# Patient Record
Sex: Male | Born: 1985 | Race: Black or African American | Hispanic: No | Marital: Married | Smoking: Current every day smoker
Health system: Southern US, Community
[De-identification: ages and names within clinical notes are randomized; demographics above are authoritative.]

## PROBLEM LIST (undated history)

## (undated) DIAGNOSIS — I10 Essential (primary) hypertension: Secondary | ICD-10-CM

## (undated) HISTORY — DX: Essential (primary) hypertension: I10

## (undated) HISTORY — PX: HAND SURGERY: SHX662

---
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
## 2006-10-05 ENCOUNTER — Emergency Department (HOSPITAL_COMMUNITY): Admission: EM | Admit: 2006-10-05 | Discharge: 2006-10-05 | Payer: Self-pay | Admitting: Emergency Medicine

## 2007-06-09 ENCOUNTER — Emergency Department (HOSPITAL_COMMUNITY): Admission: EM | Admit: 2007-06-09 | Discharge: 2007-06-09 | Payer: Self-pay | Admitting: Emergency Medicine

## 2010-01-21 ENCOUNTER — Emergency Department (HOSPITAL_COMMUNITY): Admission: EM | Admit: 2010-01-21 | Discharge: 2010-01-22 | Payer: Self-pay | Admitting: Emergency Medicine

## 2010-01-22 ENCOUNTER — Emergency Department (HOSPITAL_COMMUNITY): Admission: EM | Admit: 2010-01-22 | Discharge: 2010-01-22 | Payer: Self-pay | Admitting: Emergency Medicine

## 2011-03-15 ENCOUNTER — Inpatient Hospital Stay (INDEPENDENT_AMBULATORY_CARE_PROVIDER_SITE_OTHER)
Admission: RE | Admit: 2011-03-15 | Discharge: 2011-03-15 | Disposition: A | Discharge status: Home / Self Care | Payer: Self-pay | Source: Ambulatory Visit | Attending: Emergency Medicine | Admitting: Emergency Medicine

## 2011-03-15 ENCOUNTER — Ambulatory Visit (INDEPENDENT_AMBULATORY_CARE_PROVIDER_SITE_OTHER): Payer: Self-pay

## 2011-03-15 DIAGNOSIS — M79609 Pain in unspecified limb: Secondary | ICD-10-CM

## 2011-05-02 LAB — URINE MICROSCOPIC-ADD ON

## 2011-05-02 LAB — URINALYSIS, ROUTINE W REFLEX MICROSCOPIC
Bilirubin Urine: NEGATIVE
Glucose, UA: NEGATIVE
Hgb urine dipstick: NEGATIVE
Ketones, ur: NEGATIVE
Nitrite: NEGATIVE
Protein, ur: NEGATIVE
Specific Gravity, Urine: 1.023
Urobilinogen, UA: 1
pH: 6.5

## 2011-05-02 LAB — URINE CULTURE
Colony Count: NO GROWTH
Culture: NO GROWTH

## 2011-05-02 LAB — GC/CHLAMYDIA PROBE AMP, GENITAL: GC Probe Amp, Genital: NEGATIVE

## 2014-06-20 ENCOUNTER — Ambulatory Visit (INDEPENDENT_AMBULATORY_CARE_PROVIDER_SITE_OTHER): Payer: No Typology Code available for payment source | Admitting: Emergency Medicine

## 2014-06-20 VITALS — BP 124/80 | HR 79 | Temp 98.3°F | Resp 18 | Ht 71.0 in | Wt 159.0 lb

## 2014-06-20 DIAGNOSIS — J029 Acute pharyngitis, unspecified: Secondary | ICD-10-CM

## 2014-06-20 DIAGNOSIS — R05 Cough: Secondary | ICD-10-CM

## 2014-06-20 DIAGNOSIS — R059 Cough, unspecified: Secondary | ICD-10-CM

## 2014-06-20 LAB — POCT CBC
Granulocyte percent: 51.2 %G (ref 37–80)
HEMATOCRIT: 44.1 % (ref 43.5–53.7)
HEMOGLOBIN: 14 g/dL — AB (ref 14.1–18.1)
LYMPH, POC: 1.8 (ref 0.6–3.4)
MCH: 28 pg (ref 27–31.2)
MCHC: 31.8 g/dL (ref 31.8–35.4)
MCV: 88.1 fL (ref 80–97)
MID (cbc): 0.3 (ref 0–0.9)
MPV: 6.8 fL (ref 0–99.8)
POC Granulocyte: 2.2 (ref 2–6.9)
POC LYMPH PERCENT: 42.9 %L (ref 10–50)
POC MID %: 5.9 % (ref 0–12)
Platelet Count, POC: 258 10*3/uL (ref 142–424)
RBC: 5 M/uL (ref 4.69–6.13)
RDW, POC: 12.6 %
WBC: 4.3 10*3/uL — AB (ref 4.6–10.2)

## 2014-06-20 LAB — POCT RAPID STREP A (OFFICE): Rapid Strep A Screen: NEGATIVE

## 2014-06-20 NOTE — Progress Notes (Addendum)
Subjective:  This chart was scribed for Tom Tom Daub, MD by Haywood PaoNadim Abu Hashem, ED Scribe at [facility].The patient was seen in exam room 04 and the patient's care was started at 10:08 AM.   Patient ID: Tom Morgan, male    DOB: Mar 20, 1986, 28 y.o.   MRN: [id number] Chief Complaint  Patient presents with   Cough    x1 week   Sore Throat    From coughing x1 week   Hoarse    x1 week   HPI  HPI Comments: Tom Bowlerheodore A Handley is a 28 y.o. male who [facility] complaining of a cough, sore throat for the past week. He states is started off as a sore throat, rhinorrhea and a low grade fever. He has a HA productive cough with yellow/green sputum and loss of sleep due to the cough. Pt also states he also loses his voice when he yells. Pt believes the sputum builds up at night and he spits a lot out in the morning. He had runny nose earlier in the week but notes it has subsided. Pt says in the morning his symptoms are the worst. He drinks peppermint tea and honey which has provided some relief. Pt states he has 3 kids and a significant other ane he is worried that he may spread his sickness to them. He notes his significant mother has a cold as well.  Pt is a Naval architecttruck driver.  Review of Systems  HENT: Positive for sore throat.   Respiratory: Positive for cough.   Neurological: Positive for headaches.  Psychiatric/Behavioral: Positive for sleep disturbance.      Objective:  BP 124/80 mmHg   Pulse 79   Temp(Src) 98.3 F (36.8 C) (Oral)   Resp 18   Ht 5\' 11"  (1.803 m)   Wt 159 lb (72.122 kg)   BMI 22.19 kg/m2   SpO2 98%  Physical Exam  Constitutional: He is oriented to person, place, and time. He appears well-developed and well-nourished.  HENT:  Head: Normocephalic and atraumatic.  Right Ear: External ear normal.  Left Ear: External ear normal.  Nasal congestion.  Eyes: EOM are normal.  Neck: Normal range of motion.  Cardiovascular: Normal rate, regular rhythm and  normal heart sounds.   Pulmonary/Chest: Effort normal and breath sounds normal.  Musculoskeletal: Normal range of motion.  Neurological: He is alert and oriented to person, place, and time.  Skin: Skin is warm and dry.  Psychiatric: He has a normal mood and affect. His behavior is normal.  Nursing note and vitals reviewed.  Results for orders placed or performed in visit on 06/20/14  POCT CBC  Result Value Ref Range   WBC 4.3 (A) 4.6 - 10.2 K/uL   Lymph, poc 1.8 0.6 - 3.4   POC LYMPH PERCENT 42.9 10 - 50 %L   MID (cbc) 0.3 0 - 0.9   POC MID % 5.9 0 - 12 %M   POC Granulocyte 2.2 2 - 6.9   Granulocyte percent 51.2 37 - 80 %G   RBC 5.00 4.69 - 6.13 M/uL   Hemoglobin 14.0 (A) 14.1 - 18.1 g/dL   HCT, POC 40.944.1 81.143.5 - 53.7 %   MCV 88.1 80 - 97 fL   MCH, POC 28.0 27 - 31.2 pg   MCHC 31.8 31.8 - 35.4 g/dL   RDW, POC 91.412.6 %   Platelet Count, POC 258 142 - 424 K/uL   MPV 6.8 0 - 99.8 fL  POCT rapid strep A  Result Value Ref Range   Rapid Strep A Screen Negative Negative  No orders of the defined types were placed in this encounter.      Assessment & Plan:  Patient has a viral type illness no treatment indicated at the present time his strep test was negative.I personally performed the services described in this documentation, which was scribed in my presence. The recorded information has been reviewed and is accurate.

## 2014-06-22 LAB — CULTURE, GROUP A STREP: Organism ID, Bacteria: NORMAL

## 2014-06-24 ENCOUNTER — Ambulatory Visit: Payer: No Typology Code available for payment source

## 2015-07-21 ENCOUNTER — Ambulatory Visit (INDEPENDENT_AMBULATORY_CARE_PROVIDER_SITE_OTHER): Payer: PRIVATE HEALTH INSURANCE | Admitting: Physician Assistant

## 2015-07-21 VITALS — BP 120/80 | HR 75 | Temp 99.5°F | Resp 16 | Ht 71.0 in | Wt 161.0 lb

## 2015-07-21 DIAGNOSIS — A084 Viral intestinal infection, unspecified: Secondary | ICD-10-CM

## 2015-07-21 MED ORDER — ONDANSETRON 8 MG PO TBDP: 8.0000 mg | ORAL_TABLET | Freq: Three times a day (TID) | ORAL | Status: DC | PRN

## 2015-07-21 NOTE — Progress Notes (Signed)
   Patient ID: Tom Morgan, male    DOB: 1986/06/30, 29 y.o.   MRN: [id number]  PCP: No primary care provider on file.  Subjective:   Chief Complaint  Patient presents with  . Nausea    x 1 day  . Diarrhea  . Fever  . Dizziness    HPI Presents for evaluation of nausea and vomiting.  Nausea, vomiting, diarrhea, alternating feeling hot and cold began about 1 am today.  No blood or mucous in the emesis or stool. Became dizzy and had near-syncope-fell about 2 pm today. Did not lose consciousness. Has been working on keeping hydrated since then. Drinking Gatorade. Has had 12 ounces since 2 pm.  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  No known sick contacts. 3 kids (2 biological, 1 step), but they are not sick.  No new or unusual foods. No recent travel.  Review of Systems As above.     There are no active problems to display for this patient.    Prior to Admission medications   Not on File     No Known Allergies     Objective:  Physical Exam  Constitutional: He is oriented to person, place, and time. Vital signs are normal. He appears well-developed and well-nourished. He is active and cooperative. No distress.  BP 120/80 mmHg  Pulse 75  Temp(Src) 99.5 F (37.5 C) (Oral)  Resp 16  Ht 5\' 11"  (1.803 m)  Wt 161 lb (73.029 kg)  BMI 22.46 kg/m2  SpO2 98%  HENT:  Head: Normocephalic and atraumatic.  Right Ear: Hearing normal.  Left Ear: Hearing normal.  Eyes: Conjunctivae are normal. No scleral icterus.  Neck: Normal range of motion. Neck supple. No thyromegaly present.  Cardiovascular: Normal rate, regular rhythm and normal heart sounds.   Pulses:      Radial pulses are 2+ on the right side, and 2+ on the left side.  Pulmonary/Chest: Effort normal and breath sounds normal.  Abdominal: There is no hepatosplenomegaly. There is no tenderness (reports that he had epigastric tenderness earlier today). There is no rigidity, no rebound, no guarding, no  tenderness at McBurney's point and negative Murphy's sign.  Lymphadenopathy:       Head (right side): No tonsillar, no preauricular, no posterior auricular and no occipital adenopathy present.       Head (left side): No tonsillar, no preauricular, no posterior auricular and no occipital adenopathy present.    He has no cervical adenopathy.       Right: No supraclavicular adenopathy present.       Left: No supraclavicular adenopathy present.  Neurological: He is alert and oriented to person, place, and time. No sensory deficit.  Skin: Skin is warm, dry and intact. No rash noted. No cyanosis or erythema. Nails show no clubbing.  Psychiatric: He has a normal mood and affect. His speech is normal and behavior is normal.           Assessment & Plan:   1. Viral gastroenteritis Supportive care. Anticipatory guidance. OOW tonight. May return tomorrow if symptoms are resolved, otherwise I would extend his note x 1-2 additional days. If symptoms persist beyond that, advise to RTC. - ondansetron (ZOFRAN-ODT) 8 MG disintegrating tablet; Take 1 tablet (8 mg total) by mouth every 8 (eight) hours as needed for nausea.  Dispense: 30 tablet; Refill: 0   Fernande Brashelle S. Bronwen Pendergraft, PA-C Physician Assistant-Certified [facility]

## 2015-07-21 NOTE — Patient Instructions (Signed)

## 2017-04-13 ENCOUNTER — Emergency Department (HOSPITAL_COMMUNITY): Payer: Self-pay

## 2017-04-13 ENCOUNTER — Encounter (HOSPITAL_COMMUNITY): Payer: Self-pay

## 2017-04-13 ENCOUNTER — Emergency Department (HOSPITAL_COMMUNITY)
Admission: EM | Admit: 2017-04-13 | Discharge: 2017-04-13 | Disposition: A | Discharge status: Home / Self Care | Payer: Self-pay | Attending: Emergency Medicine | Admitting: Emergency Medicine

## 2017-04-13 DIAGNOSIS — Y929 Unspecified place or not applicable: Secondary | ICD-10-CM | POA: Insufficient documentation

## 2017-04-13 DIAGNOSIS — W230XXA Caught, crushed, jammed, or pinched between moving objects, initial encounter: Secondary | ICD-10-CM | POA: Insufficient documentation

## 2017-04-13 DIAGNOSIS — F17210 Nicotine dependence, cigarettes, uncomplicated: Secondary | ICD-10-CM | POA: Insufficient documentation

## 2017-04-13 DIAGNOSIS — Y999 Unspecified external cause status: Secondary | ICD-10-CM | POA: Insufficient documentation

## 2017-04-13 DIAGNOSIS — S60221A Contusion of right hand, initial encounter: Secondary | ICD-10-CM | POA: Insufficient documentation

## 2017-04-13 DIAGNOSIS — S6010XA Contusion of unspecified finger with damage to nail, initial encounter: Secondary | ICD-10-CM | POA: Insufficient documentation

## 2017-04-13 DIAGNOSIS — Y939 Activity, unspecified: Secondary | ICD-10-CM | POA: Insufficient documentation

## 2017-04-13 MED ORDER — OXYCODONE-ACETAMINOPHEN 5-325 MG PO TABS
1.0000 | ORAL_TABLET | Freq: Once | ORAL | Status: AC
  Administered 2017-04-13: 1 via ORAL
  Filled 2017-04-13: qty 1

## 2017-04-13 MED ORDER — NAPROXEN 500 MG PO TABS: 500.0000 mg | ORAL_TABLET | Freq: Two times a day (BID) | ORAL | 0 refills | Status: DC

## 2017-04-13 NOTE — ED Notes (Signed)
Pt ambulatory and independent at discharge.  Verbalized understanding of discharge instructions 

## 2017-04-13 NOTE — ED Triage Notes (Signed)
Patient states a tractor trailer latch fell back on his right hand this AM and is now having sharp pain.

## 2017-04-13 NOTE — ED Provider Notes (Signed)
[facility]-EMERGENCY DEPT Provider Note   CSN: [id number] Arrival date & time: 04/13/17  1540     History   Chief Complaint Chief Complaint  Patient presents with  . Hand Pain    HPI Tom Morgan is a 31 y.o. male who presents to the ED with right hand pain. The pain started suddenly when he was was closing a tractor trailer door and the handle latch slammed down on the back of the patient's hand. He complains of pain and swelling. The injury happened at 9 am.    The history is provided by the patient. No language interpreter was used.  Hand Pain  This is a new problem. The current episode started 6 to 12 hours ago. The problem has been gradually worsening. Pertinent negatives include no chest pain and no shortness of breath. Exacerbated by: movement of hand. He has tried nothing for the symptoms.    History reviewed. No pertinent past medical history.  There are no active problems to display for this patient.   Past Surgical History:  Procedure Laterality Date  . HAND SURGERY         Home Medications    Prior to Admission medications   Medication Sig Start Date End Date Taking? Authorizing Provider  naproxen (NAPROSYN) 500 MG tablet Take 1 tablet (500 mg total) by mouth 2 (two) times daily. 04/13/17   Janne Napoleon, NP  ondansetron (ZOFRAN-ODT) 8 MG disintegrating tablet Take 1 tablet (8 mg total) by mouth every 8 (eight) hours as needed for nausea. 07/21/15   Porfirio Oar, PA-C    Family History Family History  Problem Relation Age of Onset  . Osteoarthritis Mother     Social History Social History  Substance Use Topics  . Smoking status: Current Every Day Smoker    Packs/day: 0.50    Types: Cigarettes  . Smokeless tobacco: Never Used  . Alcohol use No     Allergies   Patient has no known allergies.   Review of Systems Review of Systems  Constitutional: Negative for diaphoresis.  HENT: Negative.   Eyes: Negative for visual disturbance.    Respiratory: Negative for shortness of breath.   Cardiovascular: Negative for chest pain.  Gastrointestinal: Negative for nausea and vomiting.  Musculoskeletal: Positive for arthralgias.       Right hand pain  Skin: Positive for wound.     Physical Exam Updated Vital Signs BP 135/86 (BP Location: Left Arm)   Pulse 72   Temp 98.6 F (37 C) (Oral)   Resp 17   Ht  (1.803 m)   Wt 76.2 kg (168 lb)   SpO2 98%   BMI 23.43 kg/m   Physical Exam  Constitutional: He is oriented to person, place, and time. He appears well-developed and well-nourished.  Eyes: EOM are normal.  Neck: Neck supple.  Cardiovascular: Normal rate.   Pulmonary/Chest: Effort normal.  Musculoskeletal:       Right hand: Normal sensation noted. Normal strength noted.       Hands: Right thumb with subungual hematoma. There is swelling and tenderness with palpation and range of motion to the thumb and index finger. Radial pulse 2+, adequate circulation.   Neurological: He is alert and oriented to person, place, and time. No cranial nerve deficit.  Skin: Skin is warm and dry.  Psychiatric: He has a normal mood and affect.  Nursing note and vitals reviewed.    ED Treatments / Results  Labs (all labs ordered are listed,  but only abnormal results are displayed) Labs Reviewed - No data to display  Radiology Dg Hand Complete Right  Result Date: 04/13/2017 CLINICAL DATA:  Injured thumb and tractor trailer door this morning. First and second finger pain. EXAM: RIGHT HAND - COMPLETE 3+ VIEW COMPARISON:  None. FINDINGS: There is no evidence of fracture or dislocation. There is no evidence of arthropathy or other focal bone abnormality. Soft tissues are unremarkable. IMPRESSION: Negative. Electronically Signed   By: Elberta Fortis M.D.   On: 04/13/2017 17:55    Procedures .Marland KitchenIncision and Drainage Date/Time: 04/13/2017 6:13 PM Performed by: Janne Napoleon Authorized by: Janne Napoleon   Consent:    Consent  obtained:  Verbal   Consent given by:  Patient   Risks discussed:  Bleeding Location:    Type:  Subungual hematoma   Location:  Upper extremity   Upper extremity location:  Finger   Finger location:  R thumb Pre-procedure details:    Skin preparation:  Betadine Anesthesia (see MAR for exact dosages):    Anesthesia method:  None Procedure type:    Complexity:  Simple Procedure details:    Needle aspiration: no     Drainage:  Bloody Comments:     Using a cautery a hole was made in the thumb nail and blood evacuated. Patient reported relief after procedure.    (including critical care time)  Medications Ordered in ED Medications  oxyCODONE-acetaminophen (PERCOCET/ROXICET) 5-325 MG per tablet 1 tablet (1 tablet Oral Given 04/13/17 1819)     Initial Impression / Assessment and Plan / ED Course  I have reviewed the triage vital signs and the nursing notes.  Pertinent imaging results that were available during my care of the patient were reviewed by me and considered in my medical decision making (see chart for details). 31 y.o. male with injury to the right hand and subungual hematoma of the right thumb stable for d/c without fracture or dislocation. Pain managed in the ED. Return precautions discussed   Final Clinical Impressions(s) / ED Diagnoses   Final diagnoses:  Contusion of right hand, initial encounter  Subungual hematoma of digit of hand, initial encounter    New Prescriptions Discharge Medication List as of 04/13/2017  6:38 PM    START taking these medications   Details  naproxen (NAPROSYN) 500 MG tablet Take 1 tablet (500 mg total) by mouth 2 (two) times daily., Starting Fri 04/13/2017, Print         Westwood, Nixon, NP 04/13/17 2319    Lavera Guise, MD 04/14/17 [phone]

## 2017-10-21 ENCOUNTER — Encounter (HOSPITAL_COMMUNITY): Payer: Self-pay | Admitting: *Deleted

## 2017-10-21 ENCOUNTER — Emergency Department (HOSPITAL_COMMUNITY)
Admission: EM | Admit: 2017-10-21 | Discharge: 2017-10-21 | Disposition: A | Discharge status: Home / Self Care | Payer: Self-pay | Attending: Emergency Medicine | Admitting: Emergency Medicine

## 2017-10-21 ENCOUNTER — Other Ambulatory Visit: Payer: Self-pay

## 2017-10-21 DIAGNOSIS — K0889 Other specified disorders of teeth and supporting structures: Secondary | ICD-10-CM | POA: Insufficient documentation

## 2017-10-21 DIAGNOSIS — Z79899 Other long term (current) drug therapy: Secondary | ICD-10-CM | POA: Insufficient documentation

## 2017-10-21 DIAGNOSIS — F17210 Nicotine dependence, cigarettes, uncomplicated: Secondary | ICD-10-CM | POA: Insufficient documentation

## 2017-10-21 MED ORDER — LIDOCAINE VISCOUS 2 % MT SOLN: 15.0000 mL | OROMUCOSAL | 2 refills | Status: DC | PRN

## 2017-10-21 MED ORDER — PENICILLIN V POTASSIUM 500 MG PO TABS: 500.0000 mg | ORAL_TABLET | Freq: Four times a day (QID) | ORAL | 0 refills | Status: AC

## 2017-10-21 NOTE — ED Provider Notes (Signed)
[facility] Provider Note   CSN: [id number] Arrival date & time: 10/21/17  1718     History   Chief Complaint Chief Complaint  Patient presents with  . Dental Problem    HPI Tom Morgan is a 32 y.o. male.  HPI   Tom Morgan is a 32 y.o. male, patient with no pertinent past medical history, presenting to the ED with right lower dental pain for the last 2 weeks, worsening over the past 3-4 days.  States a piece of his tooth broke off 2 weeks ago while he was eating chips.  He has a Education officer, communitydentist, however, he has not seen his dentist for this issue.  Has been taking BC powders and ibuprofen.  Denies fever/chills, nausea/vomiting, difficulty breathing or swallowing, facial swelling, or any other complaints.       History reviewed. No pertinent past medical history.  There are no active problems to display for this patient.   Past Surgical History:  Procedure Laterality Date  . HAND SURGERY          Home Medications    Prior to Admission medications   Medication Sig Start Date End Date Taking? Authorizing Provider  lidocaine (XYLOCAINE) 2 % solution Use as directed 15 mLs in the mouth or throat as needed for mouth pain. 10/21/17   Joy, Shawn C, PA-C  naproxen (NAPROSYN) 500 MG tablet Take 1 tablet (500 mg total) by mouth 2 (two) times daily. 04/13/17   Janne NapoleonNeese, Hope M, NP  ondansetron (ZOFRAN-ODT) 8 MG disintegrating tablet Take 1 tablet (8 mg total) by mouth every 8 (eight) hours as needed for nausea. 07/21/15   Porfirio OarJeffery, Chelle, PA-C  penicillin v potassium (VEETID) 500 MG tablet Take 1 tablet (500 mg total) by mouth 4 (four) times daily for 7 days. 10/21/17 10/28/17  Anselm PancoastJoy, Shawn C, PA-C    Family History Family History  Problem Relation Age of Onset  . Osteoarthritis Mother     Social History Social History   Tobacco Use  . Smoking status: Current Every Day Smoker    Packs/day: 0.50    Types: Cigarettes  . Smokeless  tobacco: Never Used  Substance Use Topics  . Alcohol use: No    Alcohol/week: 0.0 oz  . Drug use: No     Allergies   Patient has no known allergies.   Review of Systems Review of Systems  Constitutional: Negative for chills and fever.  HENT: Positive for dental problem. Negative for facial swelling, sore throat, trouble swallowing and voice change.   Respiratory: Negative for shortness of breath.   Gastrointestinal: Negative for nausea and vomiting.  Musculoskeletal: Negative for neck pain.  All other systems reviewed and are negative.    Physical Exam Updated Vital Signs BP (!) 141/84 (BP Location: Right Arm)   Pulse 77   Temp 97.9 F (36.6 C) (Oral)   Resp 12   Ht 5\' 11"  (1.803 m)   Wt 74.8 kg (165 lb)   SpO2 100%   BMI 23.01 kg/m   Physical Exam  Constitutional: He appears well-developed and well-nourished. No distress.  HENT:  Head: Normocephalic and atraumatic.  Lateral portion missing from the right mandibular second to rearmost molar.  No noted area of swelling or fluctuance.  No trismus.  Mouth opening to at least 3 finger widths.  Handles oral secretions without difficulty.  No noted facial swelling.  No swelling or tenderness to the submental or submandibular regions.  No swelling  or tenderness into the soft tissues of the neck.  Eyes: Conjunctivae are normal.  Neck: Normal range of motion. Neck supple.  Cardiovascular: Normal rate, regular rhythm and intact distal pulses.  Pulmonary/Chest: Effort normal. No respiratory distress.  Abdominal: There is no guarding.  Musculoskeletal: He exhibits no edema.  Lymphadenopathy:    He has no cervical adenopathy.  Neurological: He is alert.  Skin: Skin is warm and dry. He is not diaphoretic.  Psychiatric: He has a normal mood and affect. His behavior is normal.  Nursing note and vitals reviewed.    ED Treatments / Results  Labs (all labs ordered are listed, but only abnormal results are displayed) Labs  Reviewed - No data to display  EKG None  Radiology No results found.  Procedures Procedures (including critical care time)  Medications Ordered in ED Medications - No data to display   Initial Impression / Assessment and Plan / ED Course  I have reviewed the triage vital signs and the nursing notes.  Pertinent labs & imaging results that were available during my care of the patient were reviewed by me and considered in my medical decision making (see chart for details).     Patient presents with dental pain.  Low suspicion for sepsis or Ludwig's angioedema. I spoke with the patient about a dental block as well as using temporary cement to cover the fractured area.  Patient declined.  States he will see his dentist this week. Patient was also counseled against overuse of NSAIDs. Dental follow up. The patient was given instructions for home care as well as return precautions. Patient voices understanding of these instructions, accepts the plan, and is comfortable with discharge.   Final Clinical Impressions(s) / ED Diagnoses   Final diagnoses:  Pain, dental    ED Discharge Orders        Ordered    lidocaine (XYLOCAINE) 2 % solution  As needed     10/21/17 1846    penicillin v potassium (VEETID) 500 MG tablet  4 times daily     10/21/17 1848       Concepcion Living 10/21/17 1854    Rolland Porter, MD 10/22/17 2350

## 2017-10-21 NOTE — Discharge Instructions (Addendum)
°  Dental Pain You have been seen today for dental pain. You should follow up with a dentist as soon as possible. This problem will not resolve on its own without the care of a dentist. Use ibuprofen or naproxen for pain. Use the viscous lidocaine for mouth pain. Swish with the lidocaine and spit it out. Do not swallow it.   Please take all of your antibiotics until finished!   You may develop abdominal discomfort or diarrhea from the antibiotic.  You may help offset this with probiotics which you can buy or get in yogurt. Do not eat or take the probiotics until 2 hours after your antibiotic.   Dental Resource Guide  [facility] [address], [address] [place], [place] [address] [phone]  [facility] [address] [place], [place] [address] [phone]  [facility] [address] [place], [place] [address] [phone]  [facility] [address] [place], [place] [address] [phone]  [facility] [address] [place], [place] [address] [phone]  [facility] [email or web address]/[email or web address]  [employer] [address] [facility] [place], [place] [address] [phone]  Website for free, low-income, or sliding scale dental services in : www.freedental.us  To find a dentist in [place] and surrounding areas: [employer].[email or web address]/for-the-public/find-[email or web address]-dentist  [facility] Test[employer].athttp://www.ncdental.org/meetings-events/-missions-of-mercy  [facility] Medicaid Dentist http://www.harris.net/https://dma.ncdhhs.gov/find-[email or web address]-doctor/medicaid-dental-providers

## 2017-10-21 NOTE — ED Triage Notes (Signed)
PT reports pain to Rt posterior ,lower .

## 2017-10-21 NOTE — ED Triage Notes (Signed)
PT took  3 BC powders for pain today.

## 2017-10-21 NOTE — ED Notes (Signed)
Declined W/C at D/C and was escorted to lobby by RN. 

## 2018-06-09 ENCOUNTER — Emergency Department (HOSPITAL_COMMUNITY): Payer: Self-pay

## 2018-06-09 ENCOUNTER — Emergency Department (HOSPITAL_COMMUNITY)
Admission: EM | Admit: 2018-06-09 | Discharge: 2018-06-09 | Disposition: A | Discharge status: Home / Self Care | Payer: Self-pay | Attending: Emergency Medicine | Admitting: Emergency Medicine

## 2018-06-09 ENCOUNTER — Other Ambulatory Visit: Payer: Self-pay

## 2018-06-09 ENCOUNTER — Encounter (HOSPITAL_COMMUNITY): Payer: Self-pay

## 2018-06-09 DIAGNOSIS — F17210 Nicotine dependence, cigarettes, uncomplicated: Secondary | ICD-10-CM | POA: Insufficient documentation

## 2018-06-09 DIAGNOSIS — M549 Dorsalgia, unspecified: Secondary | ICD-10-CM

## 2018-06-09 DIAGNOSIS — W01198A Fall on same level from slipping, tripping and stumbling with subsequent striking against other object, initial encounter: Secondary | ICD-10-CM | POA: Insufficient documentation

## 2018-06-09 DIAGNOSIS — M5489 Other dorsalgia: Secondary | ICD-10-CM | POA: Insufficient documentation

## 2018-06-09 MED ORDER — NAPROXEN 500 MG PO TABS: 500.0000 mg | ORAL_TABLET | Freq: Two times a day (BID) | ORAL | 0 refills | Status: DC

## 2018-06-09 MED ORDER — METHOCARBAMOL 500 MG PO TABS: 500.0000 mg | ORAL_TABLET | Freq: Three times a day (TID) | ORAL | 0 refills | Status: DC | PRN

## 2018-06-09 NOTE — ED Triage Notes (Addendum)
Pt reports on 11/14 he was working on a loading truck and a "metal transporting cabinet" fell on him. He fell on his back with the cabinet pinning him on the ground. Pt reports lower back pain. Pt denies hitting head or any other injury.

## 2018-06-09 NOTE — ED Notes (Signed)
Bed: WTR6 Expected date:  Expected time:  Means of arrival:  Comments: 
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
## 2018-06-09 NOTE — Discharge Instructions (Addendum)
You were seen in the emergency department for back pain today.  Your xrays did not show any broken bones or fractures. It did show that you have mild scoliosis- curvature- of your spine that is unlikely to be related to your injury. We suspect you have some muscle soreness/bruising from the fall.    I have prescribed you an anti-inflammatory medication and a muscle relaxer.  - Naproxen is a nonsteroidal anti-inflammatory medication that will help with pain and swelling. Be sure to take this medication as prescribed with food, 1 pill every 12 hours,  It should be taken with food, as it can cause stomach upset, and more seriously, stomach bleeding. Do not take other nonsteroidal anti-inflammatory medications with this such as Advil, Motrin, Aleve, Mobic, Goodie Powder, or Motrin.    - Robaxin is the muscle relaxer I have prescribed, this is meant to help with muscle tightness. Be aware that this medication may make you drowsy therefore the first time you take this it should be at a time you are in an environment where you can rest. Do not drive or operate heavy machinery when taking this medication. Do not drink alcohol or take other sedating medications with this medicine such as narcotics or benzodiazepines.   You make take Tylenol per over the counter dosing with these medications.   We have prescribed you new medication(s) today. Discuss the medications prescribed today with your pharmacist as they can have adverse effects and interactions with your other medicines including over the counter and prescribed medications. Seek medical evaluation if you start to experience new or abnormal symptoms after taking one of these medicines, seek care immediately if you start to experience difficulty breathing, feeling of your throat closing, facial swelling, or rash as these could be indications of a more serious allergic reaction   The application of heat can help soothe the pain.  Maintaining your daily  activities, including walking, is encourged, as it will help you get better faster than just staying in bed.  Your pain should get better over the next 2 weeks.  You will need to follow up with  Your primary healthcare provider in 1-2 weeks for reassessment, if you do not have a primary care provider one is provided in your discharge instructions- you may see the [facility] or call the provided phone number. However return to the ER should you develop ne or worsening symptoms or any other concerns including but not limited to severe or worsening pain, low back pain with fever, numbness, weakness, loss of bowel or bladder control, or inability to walk or urinate, you should return to the ER immediately.

## 2018-07-15 ENCOUNTER — Emergency Department (HOSPITAL_COMMUNITY)
Admission: EM | Admit: 2018-07-15 | Discharge: 2018-07-15 | Disposition: A | Discharge status: Home / Self Care | Payer: Self-pay | Attending: Emergency Medicine | Admitting: Emergency Medicine

## 2018-07-15 ENCOUNTER — Other Ambulatory Visit: Payer: Self-pay

## 2018-07-15 ENCOUNTER — Encounter (HOSPITAL_COMMUNITY): Payer: Self-pay | Admitting: Emergency Medicine

## 2018-07-15 DIAGNOSIS — K029 Dental caries, unspecified: Secondary | ICD-10-CM | POA: Insufficient documentation

## 2018-07-15 DIAGNOSIS — F17210 Nicotine dependence, cigarettes, uncomplicated: Secondary | ICD-10-CM | POA: Insufficient documentation

## 2018-07-15 DIAGNOSIS — Z79899 Other long term (current) drug therapy: Secondary | ICD-10-CM | POA: Insufficient documentation

## 2018-07-15 MED ORDER — NAPROXEN 500 MG PO TABS: ORAL_TABLET | ORAL | 0 refills | Status: DC

## 2018-07-15 MED ORDER — PENICILLIN V POTASSIUM 500 MG PO TABS
500.0000 mg | ORAL_TABLET | Freq: Once | ORAL | Status: AC
  Administered 2018-07-15: 500 mg via ORAL
  Filled 2018-07-15: qty 1

## 2018-07-15 MED ORDER — BUPIVACAINE-EPINEPHRINE (PF) 0.5% -1:200000 IJ SOLN
1.8000 mL | Freq: Once | INTRAMUSCULAR | Status: AC
Start: 2018-07-15 — End: 2018-07-15
  Administered 2018-07-15: 1.8 mL
  Filled 2018-07-15: qty 1.8

## 2018-07-15 MED ORDER — PENICILLIN V POTASSIUM 500 MG PO TABS: 500.0000 mg | ORAL_TABLET | Freq: Four times a day (QID) | ORAL | 0 refills | Status: AC

## 2018-07-15 NOTE — ED Provider Notes (Signed)
[facility]-EMERGENCY DEPT Provider Note: Tom DellJ. Lane Tom Upshur, MD, FACEP  CSN: [id number] MRN: [id number] ARRIVAL: 07/15/18 at 0412 ROOM: WA13/WA13   CHIEF COMPLAINT  Dental Pain   HISTORY OF PRESENT ILLNESS  07/15/18 4:47 AM Tom Bowlerheodore A Morgan is a 32 y.o. male with pain in the bottom right side of his mouth associated with a broken tooth.  The pain is been present for several hours and he rates it as a 10 out of 10.  Pain is worse with eating or drinking.   History reviewed. No pertinent past medical history.  Past Surgical History:  Procedure Laterality Date  . HAND SURGERY      Family History  Problem Relation Age of Onset  . Osteoarthritis Mother     Social History   Tobacco Use  . Smoking status: Current Every Day Smoker    Packs/day: 0.50    Types: Cigarettes  . Smokeless tobacco: Never Used  Substance Use Topics  . Alcohol use: No    Alcohol/week: 0.0 standard drinks  . Drug use: No    Prior to Admission medications   Medication Sig Start Date End Date Taking? Authorizing Provider  lidocaine (XYLOCAINE) 2 % solution Use as directed 15 mLs in the mouth or throat as needed for mouth pain. 10/21/17   Joy, Shawn C, PA-C  methocarbamol (ROBAXIN) 500 MG tablet Take 1 tablet (500 mg total) by mouth every 8 (eight) hours as needed. 06/09/18   Petrucelli, Samantha R, PA-C  naproxen (NAPROSYN) 500 MG tablet Take 1 tablet (500 mg total) by mouth 2 (two) times daily. 06/09/18   Petrucelli, Samantha R, PA-C  ondansetron (ZOFRAN-ODT) 8 MG disintegrating tablet Take 1 tablet (8 mg total) by mouth every 8 (eight) hours as needed for nausea. 07/21/15   Porfirio OarJeffery, Chelle, PA    Allergies Patient has no known allergies.   REVIEW OF SYSTEMS  Negative except as noted here or in the History of Present Illness.   PHYSICAL EXAMINATION  Initial Vital Signs Blood pressure 126/73, pulse 70, temperature 97.7 F (36.5 C), temperature source Oral, resp. rate 16, height 5\' 11"  (1.803 m),  weight 77.1 kg, SpO2 99 %.  Examination General: Well-developed, well-nourished male in no acute distress; appearance consistent with age of record HENT: normocephalic; atraumatic; carious right lower second and third molars with tenderness to percussion Eyes: pupils equal, round and reactive to light; extraocular muscles intact Neck: supple; no lymphadenopathy Heart: regular rate and rhythm Lungs: clear to auscultation bilaterally Abdomen: soft; nondistended; nontender; bowel sounds present Extremities: No deformity; full range of motion Neurologic: Awake, alert and oriented; motor function intact in all extremities and symmetric; no facial droop Skin: Warm and dry Psychiatric: Normal mood and affect   RESULTS  Summary of this visit's results, reviewed by myself:   EKG Interpretation  Date/Time:    Ventricular Rate:    PR Interval:    QRS Duration:   QT Interval:    QTC Calculation:   R Axis:     Text Interpretation:        Laboratory Studies: No results found for this or any previous visit (from the past 24 hour(s)). Imaging Studies: No results found.  ED COURSE and MDM  Nursing notes and initial vitals signs, including pulse oximetry, reviewed.  Vitals:   07/15/18 0420 07/15/18 0421 07/15/18 0443  BP: (!) 142/91 (!) 142/91 126/73  Pulse: 77 71 70  Resp: 16 18 16   Temp: 97.7 F (36.5 C) 97.7 F (36.5 C)  TempSrc: Oral Oral   SpO2: 100% 99% 99%  Weight: 77.1 kg    Height: 5\' 11"  (1.803 m)      PROCEDURES   DENTAL BLOCK 1.8 milliliters of 0.5% bupivacaine with epinephrine were injected into the buccal fold adjacent to the right lower second and third molars. The patient tolerated this well and there were no immediate complications. Adequate analgesia was obtained.  ED DIAGNOSES     ICD-10-CM   1. Pain due to dental caries K02.9        Giovanna Kemmerer, Jonny RuizJohn, MD 07/15/18 [id number]

## 2018-07-15 NOTE — ED Triage Notes (Signed)
Patient is complaining that is throbbing on the back bottom of the right side of the mouth. The tooth has broken off. The pain started this am.

## 2020-05-14 ENCOUNTER — Encounter (HOSPITAL_COMMUNITY): Payer: Self-pay | Admitting: Emergency Medicine

## 2020-05-14 ENCOUNTER — Other Ambulatory Visit: Payer: Self-pay

## 2020-05-14 ENCOUNTER — Emergency Department (HOSPITAL_COMMUNITY): Payer: Self-pay

## 2020-05-14 ENCOUNTER — Emergency Department (HOSPITAL_COMMUNITY)
Admission: EM | Admit: 2020-05-14 | Discharge: 2020-05-14 | Disposition: A | Discharge status: Home / Self Care | Payer: Self-pay | Attending: Emergency Medicine | Admitting: Emergency Medicine

## 2020-05-14 DIAGNOSIS — R1011 Right upper quadrant pain: Secondary | ICD-10-CM | POA: Insufficient documentation

## 2020-05-14 DIAGNOSIS — R1013 Epigastric pain: Secondary | ICD-10-CM | POA: Insufficient documentation

## 2020-05-14 DIAGNOSIS — F17210 Nicotine dependence, cigarettes, uncomplicated: Secondary | ICD-10-CM | POA: Insufficient documentation

## 2020-05-14 LAB — COMPREHENSIVE METABOLIC PANEL
ALT: 16 U/L (ref 0–44)
AST: 20 U/L (ref 15–41)
Albumin: 4.2 g/dL (ref 3.5–5.0)
Alkaline Phosphatase: 38 U/L (ref 38–126)
Anion gap: 9 (ref 5–15)
BUN: 9 mg/dL (ref 6–20)
CO2: 27 mmol/L (ref 22–32)
Calcium: 9.4 mg/dL (ref 8.9–10.3)
Chloride: 104 mmol/L (ref 98–111)
Creatinine, Ser: 1.01 mg/dL (ref 0.61–1.24)
GFR, Estimated: >60 mL/min (ref >60)
Glucose, Bld: 103 mg/dL — ABNORMAL HIGH (ref 70–99)
Potassium: 4.7 mmol/L (ref 3.5–5.1)
Sodium: 140 mmol/L (ref 135–145)
Total Bilirubin: 0.7 mg/dL (ref 0.3–1.2)
Total Protein: 6.8 g/dL (ref 6.5–8.1)

## 2020-05-14 LAB — CBC
HCT: 44 % (ref 39.0–52.0)
Hemoglobin: 14.3 g/dL (ref 13.0–17.0)
MCH: 29.4 pg (ref 26.0–34.0)
MCHC: 32.5 g/dL (ref 30.0–36.0)
MCV: 90.5 fL (ref 80.0–100.0)
Platelets: 275 10*3/uL (ref 150–400)
RBC: 4.86 MIL/uL (ref 4.22–5.81)
RDW: 11.9 % (ref 11.5–15.5)
WBC: 4.6 10*3/uL (ref 4.0–10.5)
nRBC: 0 % (ref 0.0–0.2)

## 2020-05-14 LAB — URINALYSIS, ROUTINE W REFLEX MICROSCOPIC
Bilirubin Urine: NEGATIVE
Glucose, UA: NEGATIVE mg/dL
Hgb urine dipstick: NEGATIVE
Ketones, ur: 5 mg/dL — AB
Leukocytes,Ua: NEGATIVE
Nitrite: NEGATIVE
Protein, ur: NEGATIVE mg/dL
Specific Gravity, Urine: 1.029 (ref 1.005–1.030)
pH: 5 (ref 5.0–8.0)

## 2020-05-14 LAB — LIPASE, BLOOD: Lipase: 22 U/L (ref 11–51)

## 2020-05-14 MED ORDER — OMEPRAZOLE 20 MG PO CPDR: 20.0000 mg | DELAYED_RELEASE_CAPSULE | Freq: Every day | ORAL | 0 refills | Status: DC

## 2020-05-14 MED ORDER — FAMOTIDINE 20 MG PO TABS: 20.0000 mg | ORAL_TABLET | Freq: Two times a day (BID) | ORAL | 0 refills | Status: DC

## 2020-05-14 MED ORDER — OXYCODONE-ACETAMINOPHEN 5-325 MG PO TABS
1.0000 | ORAL_TABLET | ORAL | Status: DC | PRN
  Administered 2020-05-14: 1 via ORAL
  Filled 2020-05-14: qty 1

## 2020-05-14 NOTE — ED Provider Notes (Signed)
[facility] EMERGENCY DEPARTMENT Provider Note   CSN: [id number] Arrival date & time: 05/14/20  1937     History Chief Complaint  Patient presents with   Abdominal Pain    Tom Morgan is a 34 y.o. male.  Patient presents the emergency department today for evaluation of right upper quadrant and epigastric abdominal pain that has been ongoing over the past 1 month.  Pain is intermittent but occurs every day.  It was severe this morning.  He describes that the pain was so bad he was unable to get out of his vehicle to walk his son into school.  No associated nausea, vomiting, or diarrhea.  He has been alternating Tylenol and ibuprofen for pain.  He does not take NSAIDs every day.  He denies heavy alcohol use.  No chest pain or shortness of breath.  He is a smoker, half a pack daily.  No urinary symptoms.  No history of hypertension, high cholesterol, or diabetes.  Patient denies chest pain, shortness of breath.  He denies associated diaphoresis, radiation of pain, vomiting with pain.  Symptoms are not exertional.        History reviewed. No pertinent past medical history.  There are no problems to display for this patient.   Past Surgical History:  Procedure Laterality Date   HAND SURGERY         Family History  Problem Relation Age of Onset   Osteoarthritis Mother     Social History   Tobacco Use   Smoking status: Current Every Day Smoker    Packs/day: 0.50    Types: Cigarettes   Smokeless tobacco: Never Used  Vaping Use   Vaping Use: Never used  Substance Use Topics   Alcohol use: No    Alcohol/week: 0.0 standard drinks   Drug use: No    Home Medications Prior to Admission medications   Medication Sig Start Date End Date Taking? Authorizing Provider  naproxen (NAPROSYN) 500 MG tablet Take 1 tablet twice daily as needed for dental pain. Patient not taking: Reported on 05/14/2020 07/15/18   Molpus, Jonny Ruiz, MD    Allergies      Patient has no known allergies.  Review of Systems   Review of Systems  Constitutional: Negative for fever.  HENT: Negative for rhinorrhea and sore throat.   Eyes: Negative for redness.  Respiratory: Negative for cough and shortness of breath.   Cardiovascular: Negative for chest pain.  Gastrointestinal: Positive for abdominal pain. Negative for diarrhea, nausea and vomiting.  Genitourinary: Negative for dysuria and hematuria.  Musculoskeletal: Negative for myalgias.  Skin: Negative for rash.  Neurological: Negative for headaches.    Physical Exam Updated Vital Signs BP 138/90    Pulse 76    Temp 98.5 F (36.9 C) (Oral)    Resp 14    Ht 5\' 11"  (1.803 m)    Wt 73.5 kg    SpO2 100%    BMI 22.59 kg/m   Physical Exam Vitals and nursing note reviewed.  Constitutional:      Appearance: He is well-developed.  HENT:     Head: Normocephalic and atraumatic.  Eyes:     General:        Right eye: No discharge.        Left eye: No discharge.     Conjunctiva/sclera: Conjunctivae normal.  Cardiovascular:     Rate and Rhythm: Normal rate and regular rhythm.     Heart sounds: Normal heart sounds.  Pulmonary:  Effort: Pulmonary effort is normal.     Breath sounds: Normal breath sounds.  Abdominal:     Palpations: Abdomen is soft.     Tenderness: There is abdominal tenderness (Mild) in the right upper quadrant and epigastric area. There is no guarding or rebound. Negative signs include Murphy's sign.  Musculoskeletal:     Cervical back: Normal range of motion and neck supple.  Skin:    General: Skin is warm and dry.  Neurological:     Mental Status: He is alert.     ED Results / Procedures / Treatments   Labs (all labs ordered are listed, but only abnormal results are displayed) Labs Reviewed  COMPREHENSIVE METABOLIC PANEL - Abnormal; Notable for the following components:      Result Value   Glucose, Bld 103 (*)    All other components within normal limits  URINALYSIS,  ROUTINE W REFLEX MICROSCOPIC - Abnormal; Notable for the following components:   Ketones, ur 5 (*)    All other components within normal limits  LIPASE, BLOOD  CBC    EKG None  Radiology US Abdomen Limited RUQ (LIVER/GB)  Result Date: 05/14/2020 CLINICAL DATA:  Right upper quadrant abdominal pain. EXAM: ULTRASOUND ABDOMEN LIMITED RIGHT UPPER QUADRANT COMPARISON:  None. FINDINGS: Gallbladder: No gallstones or wall thickening visualized. No sonographic Murphy sign noted by sonographer. Common bile duct: Diameter: 3.4 mm, within normal limits. Liver: No focal lesion identified. Within normal limits in parenchymal echogenicity. Portal vein is patent on color Doppler imaging with normal direction of blood flow towards the liver. IMPRESSION: No evidence of acute cholecystitis. Electronically Signed   By: Feliberto Harts MD   On: 05/14/2020 14:16    Procedures Procedures (including critical care time)  Medications Ordered in ED Medications  oxyCODONE-acetaminophen (PERCOCET/ROXICET) 5-325 MG per tablet 1 tablet (1 tablet Oral Given 05/14/20 1024)    ED Course  I have reviewed the triage vital signs and the nursing notes.  Pertinent labs & imaging results that were available during my care of the patient were reviewed by me and considered in my medical decision making (see chart for details).  Patient seen and examined. Work-up reassuring.  Given severity of patient's pain this morning, will proceed with right upper quadrant ultrasound to evaluate for gallstones or other hepatobiliary pathology.  If this is negative, plan DC to home with PCP follow-up, PPI/H2 blocker.  Vital signs reviewed and are as follows: BP 138/90    Pulse 76    Temp 98.5 F (36.9 C) (Oral)    Resp 14    Ht 5\' 11"  (1.803 m)    Wt 73.5 kg    SpO2 100%    BMI 22.59 kg/m   2:34 PM right upper quadrant ultrasound performed and is negative.  Patient updated on results.  Currently pain is minimal.  He has [employer] in  the room and states that the food did make the pain worse stating that it felt like it was "brushing on the inside" (points to right costal margin).   Plan: pepcid bid x 7 days, omeprazole qd x 30 days.   The patient was urged to return to the Emergency Department immediately with worsening of current symptoms, worsening abdominal pain, persistent vomiting, blood noted in stools, fever, or any other concerns. The patient verbalized understanding.      MDM Rules/Calculators/A&P  Patient with abdominal pain. Vitals are stable, no fever. Labs reassuring. Imaging right upper quadrant ultrasound negative. No signs of dehydration, patient is tolerating PO's.  Concern for gastritis, duodenitis, peptic ulcer disease.  Lungs are clear and no signs suggestive of PNA.  Considered ACS however patient's symptoms are atypical for this.  Symptoms are not exertional and other than smoking, does not have any significant risk factors.  Low concern for appendicitis, cholecystitis, pancreatitis, ruptured viscus, UTI, kidney stone, aortic dissection, aortic aneurysm or other emergent abdominal etiology. Supportive therapy indicated with return if symptoms worsen.   Final Clinical Impression(s) / ED Diagnoses Final diagnoses:  RUQ abdominal pain    Rx / DC Orders ED Discharge Orders         Ordered    omeprazole (PRILOSEC) 20 MG capsule  Daily        05/14/20 1437    famotidine (PEPCID) 20 MG tablet  2 times daily        05/14/20 1437           Renne Crigler, PA-C 05/14/20 1438    Terald Sleeper, MD 05/14/20 1902

## 2020-05-14 NOTE — ED Notes (Signed)
Pt transported to US

## 2020-05-14 NOTE — ED Triage Notes (Signed)
Onset one month ago epigastric pain intermittently and recently pain constant currently 9/10 sharp. Denies nausea, emesis or diarrhea.

## 2020-05-14 NOTE — ED Notes (Signed)
Pt d/c home per MD order. Discharge summary reviewed with pt, pt verbalizes understanding. No s/s of acute distress noted. Ambulatory off unit.  °

## 2020-05-14 NOTE — Discharge Instructions (Signed)
Please read and follow all provided instructions.  Your diagnoses today include:  1. RUQ abdominal pain     Tests performed today include:  Blood counts and electrolytes  Blood tests to check liver and kidney function  Blood tests to check pancreas function  Ultrasound of your gallbladder and liver - appears healthy, no gallstones  Vital signs. See below for your results today.   Medications prescribed:   Pepcid (famotidine) - antihistamine  You can find this medication over-the-counter.   DO NOT exceed:   20mg  Pepcid every 12 hours   Omeprazole (Prilosec) - stomach acid reducer  This medication can be found over-the-counter  Take any prescribed medications only as directed.  Home care instructions:   Follow any educational materials contained in this packet.  Follow-up instructions: Please follow-up with your primary care provider in the next 7 days for further evaluation of your symptoms.    Return instructions:  SEEK IMMEDIATE MEDICAL ATTENTION IF:  The pain does not go away or becomes severe   A temperature above 101F develops   Repeated vomiting occurs (multiple episodes)   The pain becomes localized to portions of the abdomen. The right side could possibly be appendicitis. In an adult, the left lower portion of the abdomen could be colitis or diverticulitis.   Blood is being passed in stools or vomit (bright red or black tarry stools)   You develop chest pain, difficulty breathing, dizziness or fainting, or become confused, poorly responsive, or inconsolable (young children)  If you have any other emergent concerns regarding your health  Additional Information: Abdominal (belly) pain can be caused by many things. Your caregiver performed an examination and possibly ordered blood/urine tests and imaging (CT scan, x-rays, ultrasound). Many cases can be observed and treated at home after initial evaluation in the emergency department. Even though you are  being discharged home, abdominal pain can be unpredictable. Therefore, you need a repeated exam if your pain does not resolve, returns, or worsens. Most patients with abdominal pain don't have to be admitted to the hospital or have surgery, but serious problems like appendicitis and gallbladder attacks can start out as nonspecific pain. Many abdominal conditions cannot be diagnosed in one visit, so follow-up evaluations are very important.  Your vital signs today were: BP 138/90   Pulse 76   Temp 98.5 F (36.9 C) (Oral)   Resp 14   Ht 5\' 11"  (1.803 m)   Wt 73.5 kg   SpO2 100%   BMI 22.59 kg/m  If your blood pressure (bp) was elevated above 135/85 this visit, please have this repeated by your doctor within one month. --------------

## 2020-05-14 NOTE — ED Notes (Signed)
Visitor at bedside.

## 2021-05-20 IMAGING — US US ABDOMEN LIMITED
1 series · 14 of 25 positions shown · non-contrast
Comparison: None.

CLINICAL DATA: Right upper quadrant abdominal pain.

EXAM:
ULTRASOUND ABDOMEN LIMITED RIGHT UPPER QUADRANT

[Series 1: us abdomen limited ruq (liver/gb) · 14 of 49 slices shown]
[im 1/49]
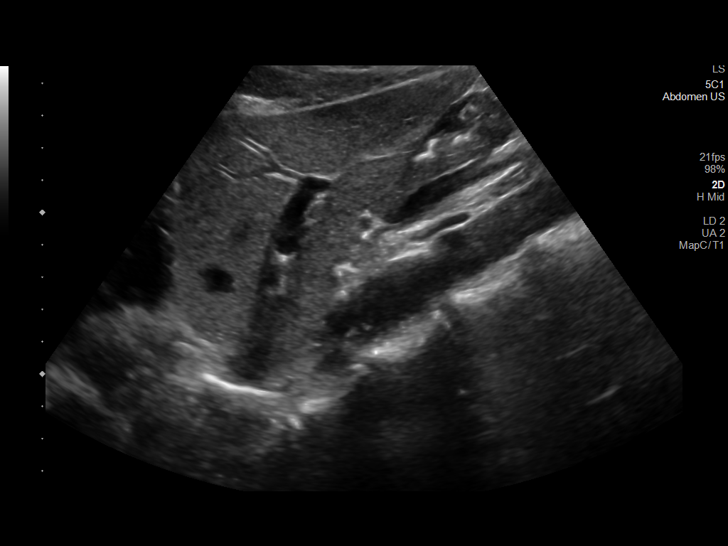
[im 5/49]
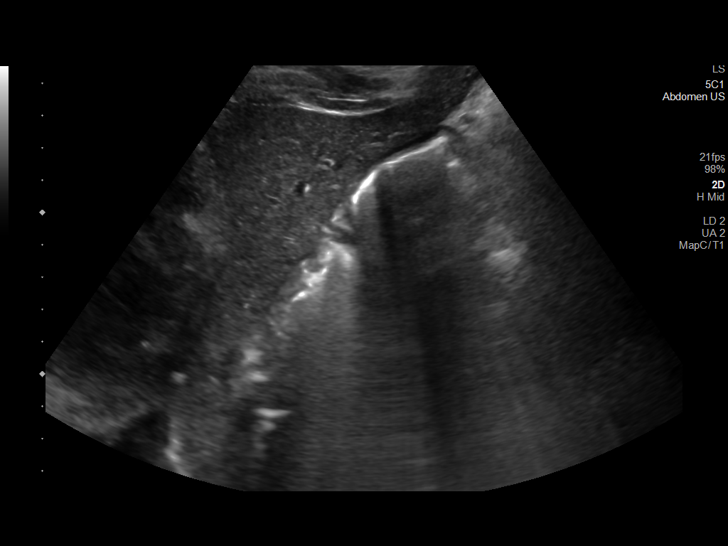
[im 9/49]
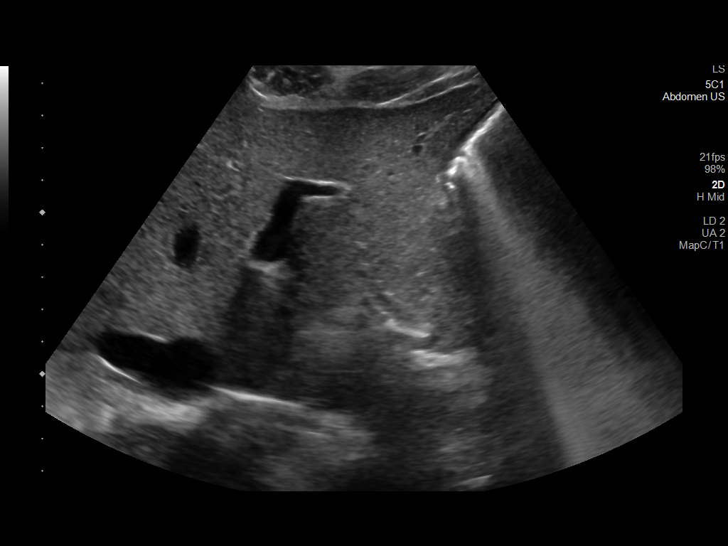
[im 13/49]
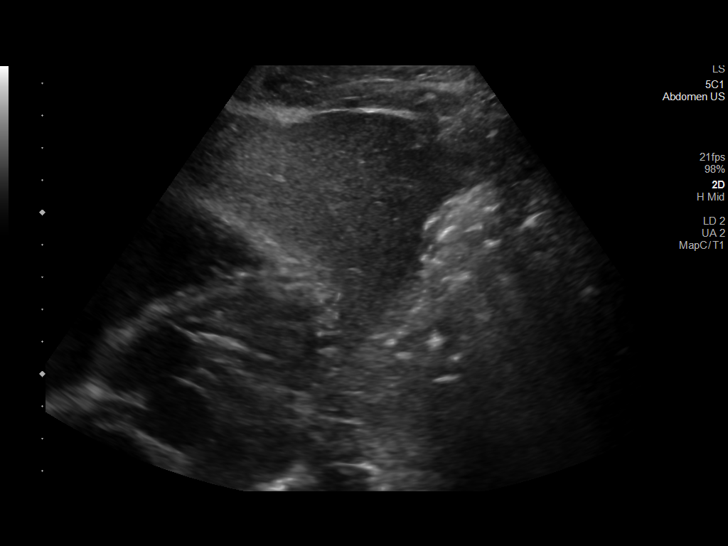
[im 17/49]
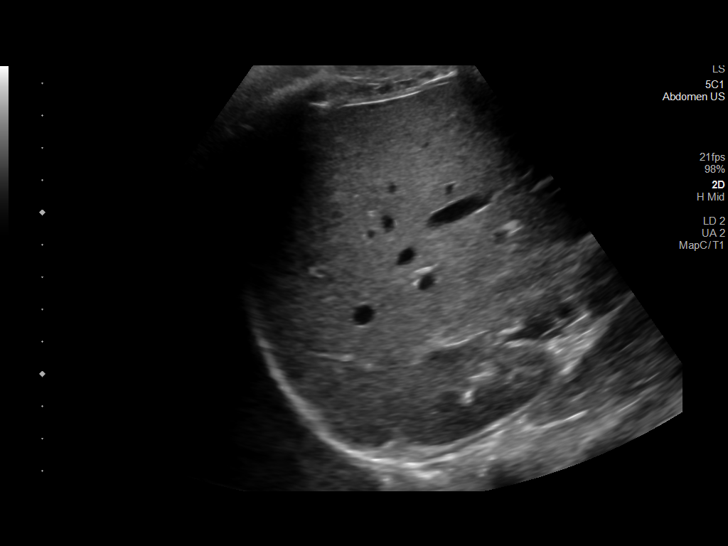
[im 19/49]
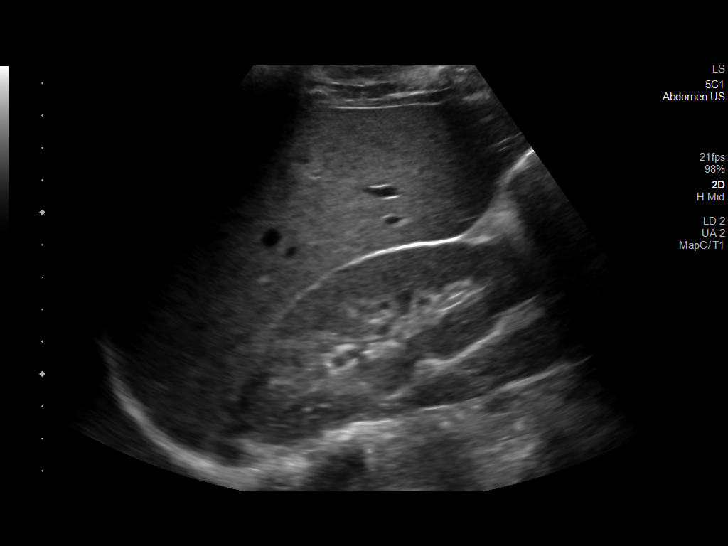
[im 23/49]
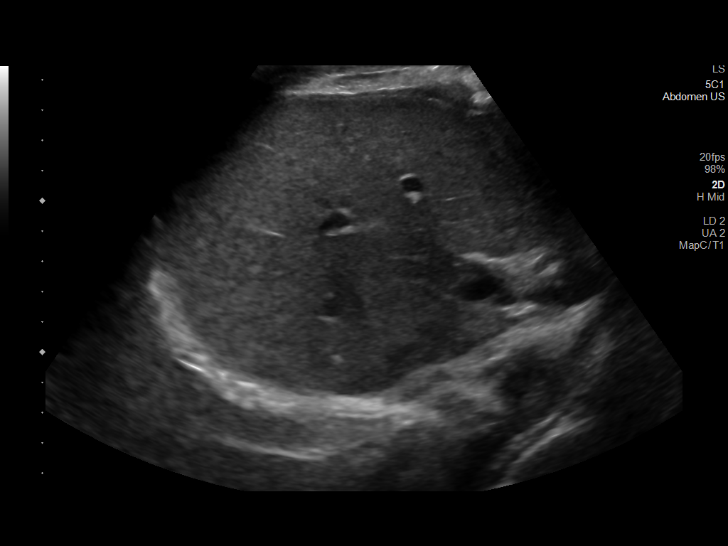
[im 27/49]
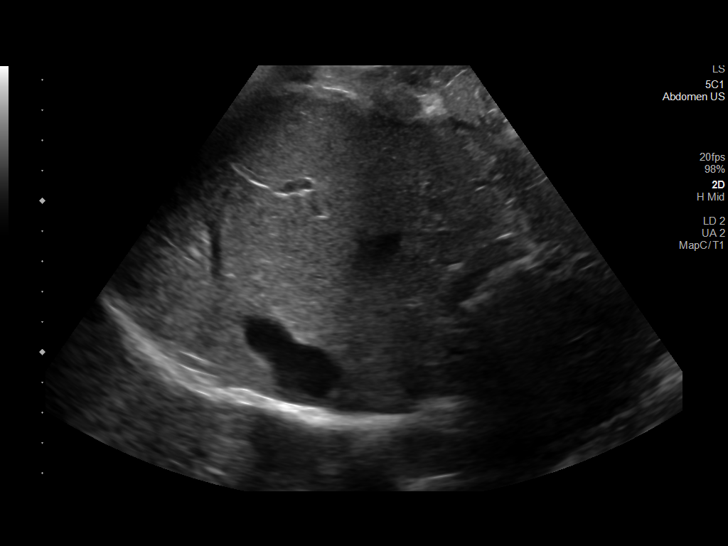
[im 31/49]
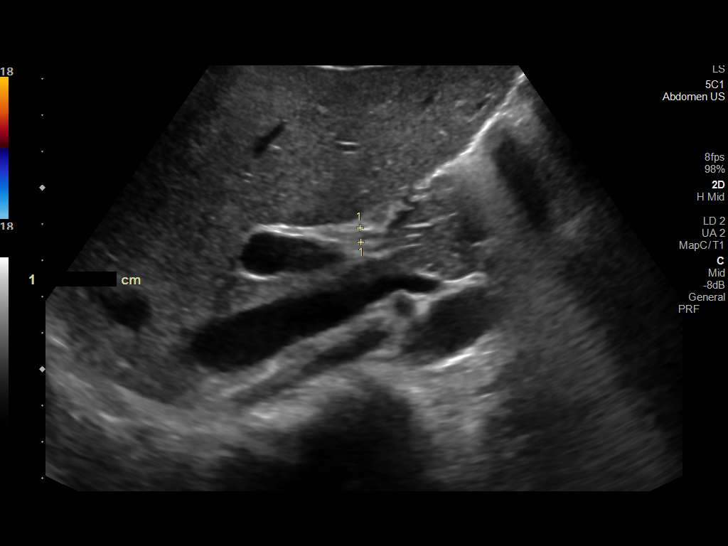
[im 33/49]
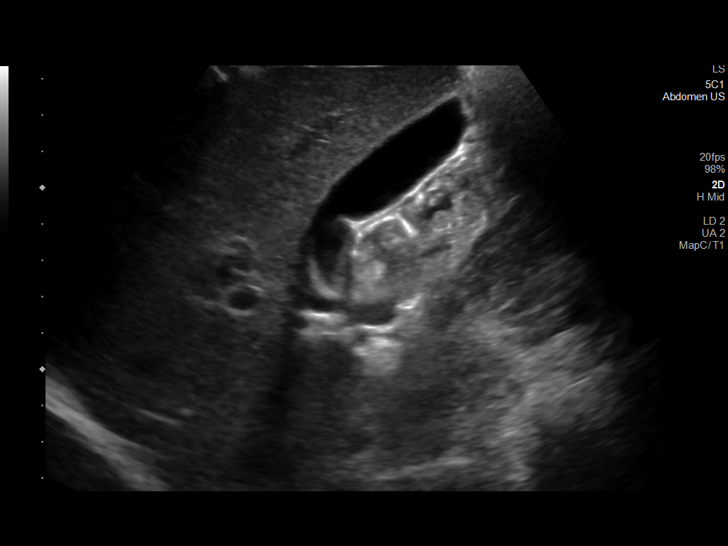
[im 37/49]
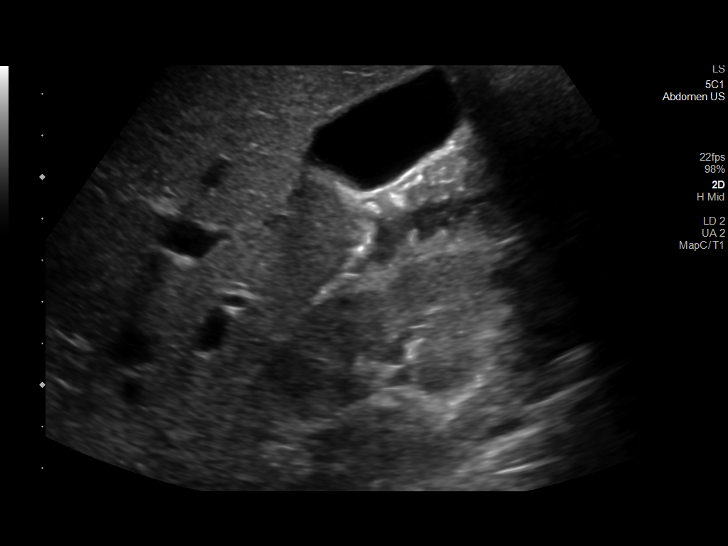
[im 41/49]
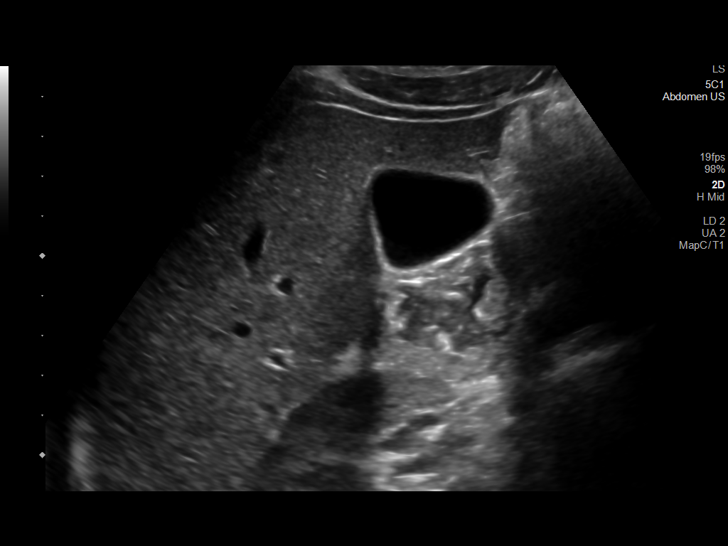
[im 45/49]
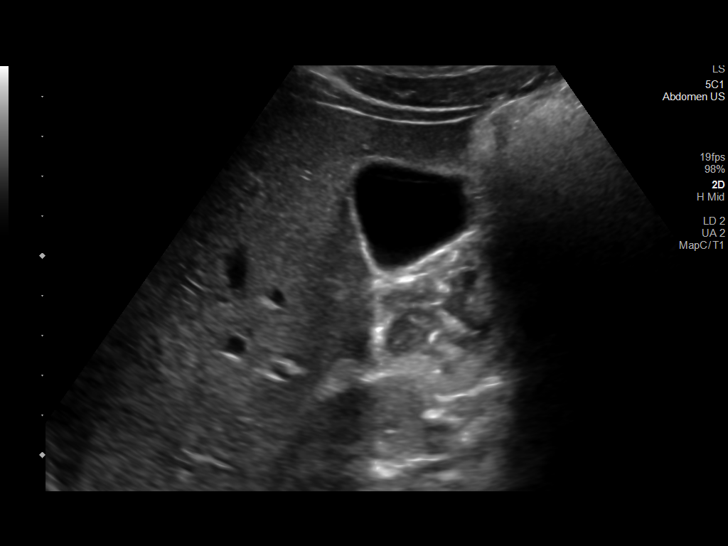
[im 49/49]
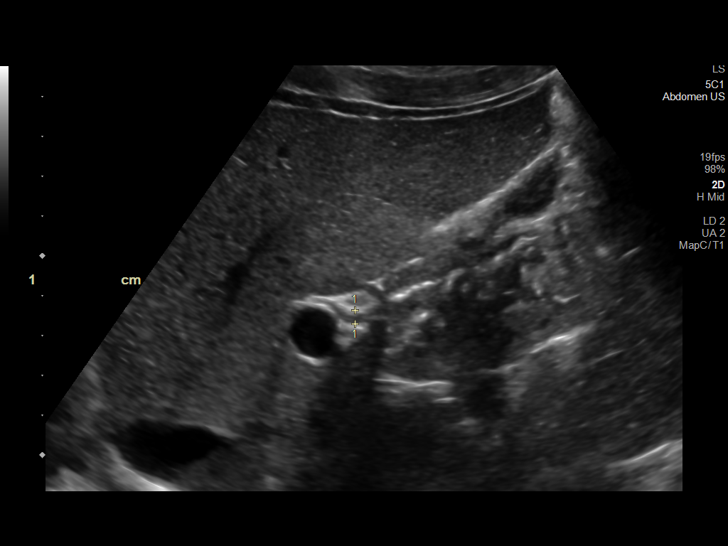

[14 of 25 positions shown; findings below may reference images not displayed]

FINDINGS: Gallbladder:

No gallstones or wall thickening visualized. No sonographic Murphy
sign noted by sonographer.

Common bile duct:

Diameter: 3.4 mm, within normal limits.

Liver:

No focal lesion identified. Within normal limits in parenchymal
echogenicity. Portal vein is patent on color Doppler imaging with
normal direction of blood flow towards the liver.
IMPRESSION: No evidence of acute cholecystitis.

## 2021-08-23 ENCOUNTER — Other Ambulatory Visit: Payer: Self-pay

## 2021-08-23 ENCOUNTER — Ambulatory Visit (HOSPITAL_COMMUNITY)
Admission: EM | Admit: 2021-08-23 | Discharge: 2021-08-23 | Disposition: A | Discharge status: Home / Self Care | Payer: Commercial Managed Care - PPO | Attending: Family Medicine | Admitting: Family Medicine

## 2021-08-23 ENCOUNTER — Encounter (HOSPITAL_COMMUNITY): Payer: Self-pay

## 2021-08-23 DIAGNOSIS — R509 Fever, unspecified: Secondary | ICD-10-CM | POA: Diagnosis not present

## 2021-08-23 DIAGNOSIS — M545 Low back pain, unspecified: Secondary | ICD-10-CM | POA: Insufficient documentation

## 2021-08-23 LAB — POCT URINALYSIS DIPSTICK, ED / UC
Glucose, UA: NEGATIVE mg/dL
Hgb urine dipstick: NEGATIVE
Ketones, ur: NEGATIVE mg/dL
Leukocytes,Ua: NEGATIVE
Nitrite: NEGATIVE
Protein, ur: NEGATIVE mg/dL
Specific Gravity, Urine: >=1.03 (ref 1.005–1.030)
Urobilinogen, UA: 0.2 mg/dL (ref 0.0–1.0)
pH: 5.5 (ref 5.0–8.0)

## 2021-08-23 MED ORDER — TRAMADOL HCL 50 MG PO TABS: 50.0000 mg | ORAL_TABLET | Freq: Four times a day (QID) | ORAL | 0 refills | Status: DC | PRN

## 2021-08-23 MED ORDER — TAMSULOSIN HCL 0.4 MG PO CAPS: 0.4000 mg | ORAL_CAPSULE | Freq: Every evening | ORAL | 0 refills | Status: DC

## 2021-08-23 MED ORDER — CIPROFLOXACIN HCL 500 MG PO TABS: 500.0000 mg | ORAL_TABLET | Freq: Two times a day (BID) | ORAL | 0 refills | Status: AC

## 2021-08-23 NOTE — Discharge Instructions (Addendum)
Your urinalysis did not show blood or white blood cells at this time.   Urine culture is sent to see if we see evidence of urinary infection  Take cipro 500 mg 1 tab twice daily for 7 days.  Take flomax 1 tab nightly to help urinary flow.  Tramadol is for pain; take 1 every 6 hours as needed for pain.  Drink lots of fluids.

## 2021-08-23 NOTE — ED Triage Notes (Signed)
Pt presents with lower back pain that has been going on "for a while", hand swelling x 1 year and fever that began this week. Pt taking tylenol for pain

## 2021-08-23 NOTE — ED Provider Notes (Signed)
[facility]    CSN: [id number] Arrival date & time: 08/23/21  1236      History   Chief Complaint Chief Complaint  Patient presents with   Back Pain    HPI Tom Morgan is a 36 y.o. male.    Back Pain Here with worsened back pain since yesterday.  He had been having low back pain bilaterally for about a year.  Then yesterday it worsened so much that he could not eat.  He has also had fever to 102 in the last day.  He did see blood in his urine about a month ago but that resolved spontaneously.  No dysuria.  No upper respiratory symptoms or vomiting or diarrhea. The back pain is in his lower lumbar area bilaterally.  He notes his hands swell sometimes but they are not swollen currently  The pain is not radiating into his groin. He has had a little lower abd pain, but not currently.  History reviewed. No pertinent past medical history.  There are no problems to display for this patient.   Past Surgical History:  Procedure Laterality Date   HAND SURGERY         Home Medications    Prior to Admission medications   Medication Sig Start Date End Date Taking? Authorizing Provider  ciprofloxacin (CIPRO) 500 MG tablet Take 1 tablet (500 mg total) by mouth 2 (two) times daily for 7 days. 08/23/21 08/30/21 Yes Amadea Keagy, Gwenlyn Perking, MD  tamsulosin (FLOMAX) 0.4 MG CAPS capsule Take 1 capsule (0.4 mg total) by mouth at bedtime. 08/23/21  Yes Numair Masden, Gwenlyn Perking, MD  traMADol (ULTRAM) 50 MG tablet Take 1 tablet (50 mg total) by mouth every 6 (six) hours as needed. 08/23/21  Yes Barrett Henle, MD    Family History Family History  Problem Relation Age of Onset   Osteoarthritis Mother     Social History Social History   Tobacco Use   Smoking status: Every Day    Packs/day: 0.50    Types: Cigarettes   Smokeless tobacco: Never  Vaping Use   Vaping Use: Never used  Substance Use Topics   Alcohol use: No    Alcohol/week: 0.0 standard drinks   Drug use: No      Allergies   Patient has no known allergies.   Review of Systems Review of Systems  Musculoskeletal:  Positive for back pain.    Physical Exam Triage Vital Signs ED Triage Vitals  Enc Vitals Group     BP 08/23/21 1420 (!) 123/99     Pulse Rate 08/23/21 1420 65     Resp 08/23/21 1420 14     Temp 08/23/21 1420 98.2 F (36.8 C)     Temp Source 08/23/21 1420 Oral     SpO2 08/23/21 1420 99 %     Weight --      Height --      Head Circumference --      Peak Flow --      Pain Score 08/23/21 1423 6     Pain Loc --      Pain Edu? --      Excl. in [place]? --    No data found.  Updated Vital Signs BP (!) 123/99 (BP Location: Left Arm)    Pulse 65    Temp 98.2 F (36.8 C) (Oral)    Resp 14    SpO2 99%   Visual Acuity Right Eye Distance:   Left Eye Distance:  Bilateral Distance:    Right Eye Near:   Left Eye Near:    Bilateral Near:     Physical Exam Vitals reviewed.  Constitutional:      General: He is not in acute distress.    Appearance: He is not toxic-appearing.  HENT:     Nose: Nose normal.     Mouth/Throat:     Mouth: Mucous membranes are moist.  Eyes:     Extraocular Movements: Extraocular movements intact.     Pupils: Pupils are equal, round, and reactive to light.  Cardiovascular:     Rate and Rhythm: Normal rate and regular rhythm.     Heart sounds: No murmur heard. Pulmonary:     Effort: Pulmonary effort is normal.     Breath sounds: Normal breath sounds.  Abdominal:     General: Bowel sounds are normal.     Palpations: Abdomen is soft. There is no mass.     Tenderness: There is no abdominal tenderness. There is no right CVA tenderness or left CVA tenderness.  Musculoskeletal:     Cervical back: Neck supple.  Neurological:     General: No focal deficit present.     Mental Status: He is alert and oriented to person, place, and time.  Psychiatric:        Behavior: Behavior normal.     UC Treatments / Results  Labs (all labs ordered are  listed, but only abnormal results are displayed) Labs Reviewed  POCT URINALYSIS DIPSTICK, ED / UC - Abnormal; Notable for the following components:      Result Value   Bilirubin Urine SMALL (*)    All other components within normal limits  URINE CULTURE    EKG   Radiology No results found.  Procedures Procedures (including critical care time)  Medications Ordered in UC Medications - No data to display  Initial Impression / Assessment and Plan / UC Course  I have reviewed the triage vital signs and the nursing notes.  Pertinent labs & imaging results that were available during my care of the patient were reviewed by me and considered in my medical decision making (see chart for details).    UA did not have blood or WBC. C/S sent. Will treat with cipro for poss pyelo, and flomax for urinary flow, in case symptoms in part due to renal stone. Info given on primary care and on urology.  Final Clinical Impressions(s) / UC Diagnoses   Final diagnoses:  Acute bilateral low back pain without sciatica  Fever, unspecified     Discharge Instructions      Your urinalysis did not show blood or white blood cells at this time.   Urine culture is sent to see if we see evidence of urinary infection  Take cipro 500 mg 1 tab twice daily for 7 days.  Take flomax 1 tab nightly to help urinary flow.  Tramadol is for pain; take 1 every 6 hours as needed for pain.  Drink lots of fluids.       ED Prescriptions     Medication Sig Dispense Auth. Provider   ciprofloxacin (CIPRO) 500 MG tablet Take 1 tablet (500 mg total) by mouth 2 (two) times daily for 7 days. 14 tablet Jodie Cavey, Gwenlyn Perking, MD   tamsulosin (FLOMAX) 0.4 MG CAPS capsule Take 1 capsule (0.4 mg total) by mouth at bedtime. 10 capsule Barrett Henle, MD   traMADol (ULTRAM) 50 MG tablet Take 1 tablet (50 mg total) by mouth every  6 (six) hours as needed. 10 tablet Windy Carina Gwenlyn Perking, MD      I have reviewed the PDMP  during this encounter.   Barrett Henle, MD 08/23/21 1525

## 2021-08-24 LAB — URINE CULTURE: Culture: <10000 — AB

## 2022-02-13 ENCOUNTER — Other Ambulatory Visit: Payer: Self-pay

## 2022-02-13 ENCOUNTER — Emergency Department (HOSPITAL_COMMUNITY): Payer: No Typology Code available for payment source

## 2022-02-13 ENCOUNTER — Encounter (HOSPITAL_COMMUNITY): Payer: Self-pay

## 2022-02-13 ENCOUNTER — Emergency Department (HOSPITAL_COMMUNITY)
Admission: EM | Admit: 2022-02-13 | Discharge: 2022-02-13 | Disposition: A | Discharge status: Home / Self Care | Payer: No Typology Code available for payment source | Attending: Emergency Medicine | Admitting: Emergency Medicine

## 2022-02-13 DIAGNOSIS — M542 Cervicalgia: Secondary | ICD-10-CM | POA: Diagnosis present

## 2022-02-13 DIAGNOSIS — Y92410 Unspecified street and highway as the place of occurrence of the external cause: Secondary | ICD-10-CM | POA: Diagnosis not present

## 2022-02-13 MED ORDER — KETOROLAC TROMETHAMINE 10 MG PO TABS: 10.0000 mg | ORAL_TABLET | Freq: Four times a day (QID) | ORAL | 0 refills | Status: DC | PRN

## 2022-02-13 MED ORDER — METHOCARBAMOL 500 MG PO TABS: 500.0000 mg | ORAL_TABLET | Freq: Two times a day (BID) | ORAL | 0 refills | Status: DC

## 2022-02-13 MED ORDER — HYDROCODONE-ACETAMINOPHEN 5-325 MG PO TABS
1.0000 | ORAL_TABLET | Freq: Once | ORAL | Status: AC
  Administered 2022-02-13: 1 via ORAL
  Filled 2022-02-13: qty 1

## 2022-02-13 MED ORDER — KETOROLAC TROMETHAMINE 30 MG/ML IJ SOLN
30.0000 mg | Freq: Once | INTRAMUSCULAR | Status: AC
  Administered 2022-02-13: 30 mg via INTRAMUSCULAR
  Filled 2022-02-13: qty 1
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
## 2022-02-14 ENCOUNTER — Ambulatory Visit: Payer: Self-pay | Admitting: *Deleted
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
## 2022-02-15 ENCOUNTER — Ambulatory Visit: Payer: Self-pay | Admitting: Physician Assistant

## 2022-02-15 ENCOUNTER — Encounter: Payer: Self-pay | Admitting: Physician Assistant

## 2022-02-15 VITALS — BP 139/95 | HR 69 | Ht 71.0 in | Wt 164.0 lb

## 2022-02-15 DIAGNOSIS — R030 Elevated blood-pressure reading, without diagnosis of hypertension: Secondary | ICD-10-CM

## 2022-02-15 DIAGNOSIS — M542 Cervicalgia: Secondary | ICD-10-CM | POA: Insufficient documentation

## 2022-02-15 DIAGNOSIS — I10 Essential (primary) hypertension: Secondary | ICD-10-CM | POA: Insufficient documentation

## 2022-02-15 MED ORDER — KETOROLAC TROMETHAMINE 60 MG/2ML IM SOLN
60.0000 mg | Freq: Once | INTRAMUSCULAR | Status: AC
  Administered 2022-02-15: 60 mg via INTRAMUSCULAR

## 2022-02-15 MED ORDER — METHYLPREDNISOLONE ACETATE 40 MG/ML IJ SUSP
40.0000 mg | Freq: Once | INTRAMUSCULAR | Status: AC
  Administered 2022-02-15: 40 mg via INTRAMUSCULAR
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
## 2022-02-20 ENCOUNTER — Encounter (HOSPITAL_BASED_OUTPATIENT_CLINIC_OR_DEPARTMENT_OTHER): Payer: Self-pay | Admitting: Emergency Medicine

## 2022-02-20 ENCOUNTER — Emergency Department (HOSPITAL_BASED_OUTPATIENT_CLINIC_OR_DEPARTMENT_OTHER)
Admission: EM | Admit: 2022-02-20 | Discharge: 2022-02-20 | Disposition: A | Discharge status: Home / Self Care | Payer: No Typology Code available for payment source | Attending: Emergency Medicine | Admitting: Emergency Medicine

## 2022-02-20 ENCOUNTER — Other Ambulatory Visit: Payer: Self-pay

## 2022-02-20 DIAGNOSIS — M546 Pain in thoracic spine: Secondary | ICD-10-CM | POA: Diagnosis not present

## 2022-02-20 DIAGNOSIS — M549 Dorsalgia, unspecified: Secondary | ICD-10-CM | POA: Diagnosis present

## 2022-02-20 DIAGNOSIS — M542 Cervicalgia: Secondary | ICD-10-CM | POA: Insufficient documentation

## 2022-02-20 MED ORDER — PREDNISONE 10 MG (21) PO TBPK: ORAL_TABLET | Freq: Every day | ORAL | 0 refills | Status: DC

## 2022-02-20 MED ORDER — OXYCODONE-ACETAMINOPHEN 5-325 MG PO TABS
2.0000 | ORAL_TABLET | Freq: Once | ORAL | Status: AC
  Administered 2022-02-20: 2 via ORAL
  Filled 2022-02-20: qty 2

## 2022-02-20 MED ORDER — DEXAMETHASONE SODIUM PHOSPHATE 10 MG/ML IJ SOLN
10.0000 mg | Freq: Once | INTRAMUSCULAR | Status: AC
  Administered 2022-02-20: 10 mg via INTRAMUSCULAR
  Filled 2022-02-20: qty 1
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
## 2022-02-20 NOTE — ED Notes (Signed)
Patient verbalizes understanding of discharge instructions. Opportunity for questioning and answers were provided. Patient discharged from ED.  °
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
## 2022-03-13 ENCOUNTER — Telehealth: Payer: Self-pay | Admitting: Critical Care Medicine

## 2022-03-13 ENCOUNTER — Ambulatory Visit: Payer: Self-pay | Attending: Critical Care Medicine | Admitting: Critical Care Medicine

## 2022-03-13 ENCOUNTER — Encounter: Payer: Self-pay | Admitting: Critical Care Medicine

## 2022-03-13 VITALS — BP 146/96 | HR 83 | Wt 166.0 lb

## 2022-03-13 DIAGNOSIS — G8929 Other chronic pain: Secondary | ICD-10-CM

## 2022-03-13 DIAGNOSIS — M546 Pain in thoracic spine: Secondary | ICD-10-CM

## 2022-03-13 DIAGNOSIS — Z720 Tobacco use: Secondary | ICD-10-CM | POA: Insufficient documentation

## 2022-03-13 DIAGNOSIS — M542 Cervicalgia: Secondary | ICD-10-CM

## 2022-03-13 DIAGNOSIS — Z1159 Encounter for screening for other viral diseases: Secondary | ICD-10-CM

## 2022-03-13 DIAGNOSIS — Z114 Encounter for screening for human immunodeficiency virus [HIV]: Secondary | ICD-10-CM

## 2022-03-13 DIAGNOSIS — I10 Essential (primary) hypertension: Secondary | ICD-10-CM

## 2022-03-13 DIAGNOSIS — Z139 Encounter for screening, unspecified: Secondary | ICD-10-CM

## 2022-03-13 MED ORDER — METHOCARBAMOL 500 MG PO TABS: 500.0000 mg | ORAL_TABLET | Freq: Two times a day (BID) | ORAL | 0 refills | Status: DC

## 2022-03-13 MED ORDER — MELOXICAM 15 MG PO TABS: 15.0000 mg | ORAL_TABLET | Freq: Every day | ORAL | 0 refills | Status: DC

## 2022-03-13 MED ORDER — AMLODIPINE BESYLATE 5 MG PO TABS: 5.0000 mg | ORAL_TABLET | Freq: Every day | ORAL | 2 refills | Status: DC

## 2022-03-13 NOTE — Assessment & Plan Note (Signed)
Neck pain improved we will send to physical therapy and obtain orthopedic spine referral  Trial of meloxicam 15 mg daily and methocarbamol as needed for muscle spasm

## 2022-03-13 NOTE — Telephone Encounter (Signed)
Can you see this patient he has severe anxiety and some depression he is not suicidal I do not think he needs medications only counseling is mostly stress around family matters and tobacco use

## 2022-03-13 NOTE — Patient Instructions (Signed)
Stop smoking see attachment  See lifestyle medicine handout we issued for dietary choices  Start amlodipine 1 pill daily for blood pressure  Meloxicam and methocarbamol given for back pain  Referral to physical therapy and orthopedic surgery given for your back  Complete health screening labs obtained at this visit  Return to Dr. Delford Field 1 month for blood pressure recheck

## 2022-03-13 NOTE — Assessment & Plan Note (Signed)
Plan to begin amlodipine 5 mg daily and I gave the patient lifestyle medicine handout  Check all health screening labs

## 2022-03-13 NOTE — Assessment & Plan Note (Signed)
  .   Current smoking consumption amount: 5 packs a week  . Dicsussion on advise to quit smoking and smoking impacts: Vascular impact  . Patient's willingness to quit: Wants to quit  . Methods to quit smoking discussed: Behavioral modification nicotine replacement  . Medication management of smoking session drugs discussed: Nicotine lozenges  . Resources provided:  AVS   Setting quit date not established . Follow-up arranged 1 month   Time spent counseling the patient: 5 minutes

## 2022-03-14 LAB — COMPREHENSIVE METABOLIC PANEL
ALT: 13 IU/L (ref 0–44)
AST: 13 IU/L (ref 0–40)
Albumin/Globulin Ratio: 2 (ref 1.2–2.2)
Albumin: 4.7 g/dL (ref 4.1–5.1)
Alkaline Phosphatase: 53 IU/L (ref 44–121)
BUN/Creatinine Ratio: 7 — ABNORMAL LOW (ref 9–20)
BUN: 8 mg/dL (ref 6–20)
Bilirubin Total: <0.2 mg/dL (ref 0.0–1.2)
CO2: 26 mmol/L (ref 20–29)
Calcium: 9.4 mg/dL (ref 8.7–10.2)
Chloride: 98 mmol/L (ref 96–106)
Creatinine, Ser: 1.07 mg/dL (ref 0.76–1.27)
Globulin, Total: 2.3 g/dL (ref 1.5–4.5)
Glucose: 90 mg/dL (ref 70–99)
Potassium: 4.1 mmol/L (ref 3.5–5.2)
Sodium: 139 mmol/L (ref 134–144)
Total Protein: 7 g/dL (ref 6.0–8.5)
eGFR: 93 mL/min/{1.73_m2} (ref >59)

## 2022-03-14 LAB — LIPID PANEL
Chol/HDL Ratio: 3.4 ratio (ref 0.0–5.0)
Cholesterol, Total: 178 mg/dL (ref 100–199)
HDL: 52 mg/dL (ref >39)
LDL Chol Calc (NIH): 112 mg/dL — ABNORMAL HIGH (ref 0–99)
Triglycerides: 76 mg/dL (ref 0–149)
VLDL Cholesterol Cal: 14 mg/dL (ref 5–40)

## 2022-03-14 LAB — CBC WITH DIFFERENTIAL/PLATELET
Basophils Absolute: 0 10*3/uL (ref 0.0–0.2)
Basos: 1 %
EOS (ABSOLUTE): 0.2 10*3/uL (ref 0.0–0.4)
Eos: 3 %
Hematocrit: 38 % (ref 37.5–51.0)
Hemoglobin: 13.3 g/dL (ref 13.0–17.7)
Immature Grans (Abs): 0 10*3/uL (ref 0.0–0.1)
Immature Granulocytes: 0 %
Lymphocytes Absolute: 2.5 10*3/uL (ref 0.7–3.1)
Lymphs: 44 %
MCH: 29.4 pg (ref 26.6–33.0)
MCHC: 35 g/dL (ref 31.5–35.7)
MCV: 84 fL (ref 79–97)
Monocytes Absolute: 0.4 10*3/uL (ref 0.1–0.9)
Monocytes: 7 %
Neutrophils Absolute: 2.5 10*3/uL (ref 1.4–7.0)
Neutrophils: 45 %
Platelets: 223 10*3/uL (ref 150–450)
RBC: 4.52 x10E6/uL (ref 4.14–5.80)
RDW: 11.7 % (ref 11.6–15.4)
WBC: 5.6 10*3/uL (ref 3.4–10.8)

## 2022-03-14 LAB — HEMOGLOBIN A1C
Est. average glucose Bld gHb Est-mCnc: 120 mg/dL
Hgb A1c MFr Bld: 5.8 % — ABNORMAL HIGH (ref 4.8–5.6)

## 2022-03-14 LAB — HCV AB W REFLEX TO QUANT PCR: HCV Ab: NONREACTIVE

## 2022-03-14 LAB — HIV ANTIBODY (ROUTINE TESTING W REFLEX): HIV Screen 4th Generation wRfx: NONREACTIVE

## 2022-03-14 LAB — HCV INTERPRETATION

## 2022-03-14 NOTE — Progress Notes (Signed)
Let pt know liver kidney normal  hiv hep C neg, blood count normal , cholesterol high:  follow healthy plant based diet no medication needed  mild elevated HgBa1c suggests prediabetes, no medication needed: follow healthy diet

## 2022-03-16 ENCOUNTER — Ambulatory Visit: Payer: Self-pay | Admitting: *Deleted

## 2022-03-16 ENCOUNTER — Telehealth: Payer: Self-pay

## 2022-03-16 NOTE — Telephone Encounter (Signed)
Pt was called and vm was left, Information has been sent to nurse pool.   

## 2022-03-16 NOTE — Telephone Encounter (Signed)
Pt given lab results per notes of Dr. Delford Field from 03/14/22 on 03/16/22. Pt verbalized understanding to follow a health diet, plant based diet and no medications needed at this time.

## 2022-03-22 NOTE — Telephone Encounter (Signed)
LCSWA called patient today to introduce herself and to assess patients' mental health needs. Patient did not answer the phone. LCSWA was able to leave a brief message with the patient asking them to return the call. Patient was referred by PCP for anxiety and depression.   

## 2022-03-23 ENCOUNTER — Ambulatory Visit (INDEPENDENT_AMBULATORY_CARE_PROVIDER_SITE_OTHER): Payer: Self-pay | Admitting: Sports Medicine

## 2022-03-23 ENCOUNTER — Encounter: Payer: Self-pay | Admitting: Sports Medicine

## 2022-03-23 ENCOUNTER — Ambulatory Visit (INDEPENDENT_AMBULATORY_CARE_PROVIDER_SITE_OTHER): Payer: Self-pay

## 2022-03-23 VITALS — BP 117/74 | HR 71 | Ht 70.0 in | Wt 168.2 lb

## 2022-03-23 DIAGNOSIS — M545 Low back pain, unspecified: Secondary | ICD-10-CM

## 2022-03-23 MED ORDER — MELOXICAM 15 MG PO TABS: 15.0000 mg | ORAL_TABLET | Freq: Every day | ORAL | 1 refills | Status: DC
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
## 2022-04-03 ENCOUNTER — Ambulatory Visit: Payer: PRIVATE HEALTH INSURANCE | Attending: Critical Care Medicine

## 2022-04-03 DIAGNOSIS — M546 Pain in thoracic spine: Secondary | ICD-10-CM | POA: Insufficient documentation

## 2022-04-03 DIAGNOSIS — M5459 Other low back pain: Secondary | ICD-10-CM | POA: Insufficient documentation

## 2022-04-03 DIAGNOSIS — M6281 Muscle weakness (generalized): Secondary | ICD-10-CM | POA: Insufficient documentation
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
## 2022-04-10 ENCOUNTER — Ambulatory Visit: Payer: PRIVATE HEALTH INSURANCE

## 2022-04-10 DIAGNOSIS — M5459 Other low back pain: Secondary | ICD-10-CM

## 2022-04-10 DIAGNOSIS — M6281 Muscle weakness (generalized): Secondary | ICD-10-CM | POA: Diagnosis present

## 2022-04-10 DIAGNOSIS — M546 Pain in thoracic spine: Secondary | ICD-10-CM
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
## 2022-04-17 ENCOUNTER — Encounter: Payer: Self-pay | Admitting: Critical Care Medicine

## 2022-04-17 ENCOUNTER — Ambulatory Visit: Payer: PRIVATE HEALTH INSURANCE | Attending: Critical Care Medicine | Admitting: Critical Care Medicine

## 2022-04-17 ENCOUNTER — Ambulatory Visit: Payer: PRIVATE HEALTH INSURANCE

## 2022-04-17 VITALS — BP 141/89 | HR 72 | Ht 71.0 in | Wt 170.8 lb

## 2022-04-17 DIAGNOSIS — M542 Cervicalgia: Secondary | ICD-10-CM | POA: Diagnosis not present

## 2022-04-17 DIAGNOSIS — I10 Essential (primary) hypertension: Secondary | ICD-10-CM

## 2022-04-17 DIAGNOSIS — Z720 Tobacco use: Secondary | ICD-10-CM | POA: Diagnosis not present

## 2022-04-17 MED ORDER — AMLODIPINE BESYLATE 10 MG PO TABS: 10.0000 mg | ORAL_TABLET | Freq: Every day | ORAL | 2 refills | Status: DC

## 2022-04-17 MED ORDER — BUPROPION HCL ER (XL) 150 MG PO TB24: 150.0000 mg | ORAL_TABLET | Freq: Every day | ORAL | 2 refills | Status: DC

## 2022-04-17 MED ORDER — METHOCARBAMOL 500 MG PO TABS: 500.0000 mg | ORAL_TABLET | Freq: Two times a day (BID) | ORAL | 1 refills | Status: DC

## 2022-04-17 MED ORDER — MELOXICAM 15 MG PO TABS: 15.0000 mg | ORAL_TABLET | Freq: Every day | ORAL | 1 refills | Status: DC

## 2022-04-17 NOTE — Assessment & Plan Note (Signed)
Still smoking and not well controlled we will plan to increase amlodipine to 10 mg daily and have patient come back short-term to see clinical pharmacy

## 2022-04-17 NOTE — Assessment & Plan Note (Signed)
  .   Current smoking consumption amount: 5 packs a week  . Dicsussion on advise to quit smoking and smoking impacts: Vascular impact  . Patient's willingness to quit: Wants to quit  Methods to quit smoking discussed: Behavioral modification Medication management of smoking session drugs discussed: Failed nicotine replacement will have trial of bupropion . Resources provided:  AVS   Setting quit date not established . Follow-up arranged 1 month   Time spent counseling the patient: 5 minutes

## 2022-04-17 NOTE — Patient Instructions (Addendum)
You declined the flu vaccine  Increase amlodipine to 10 mg daily can take 2 of the 5 mg daily until bottle runs out but the refill be a single 10 mg tablet to take 1 daily  Start bupropion 150 mg daily to stop smoking  Return to see Dr. Joya Gaskins 3 months and see Lurena Joiner our clinical pharmacist in 6 weeks for follow-up on your blood pressure

## 2022-04-17 NOTE — Assessment & Plan Note (Signed)
Improved with physical therapy.

## 2022-04-24 ENCOUNTER — Ambulatory Visit: Payer: PRIVATE HEALTH INSURANCE | Attending: Critical Care Medicine

## 2022-04-24 DIAGNOSIS — M5459 Other low back pain: Secondary | ICD-10-CM | POA: Insufficient documentation

## 2022-04-24 DIAGNOSIS — M6281 Muscle weakness (generalized): Secondary | ICD-10-CM | POA: Insufficient documentation

## 2022-04-24 DIAGNOSIS — M546 Pain in thoracic spine: Secondary | ICD-10-CM | POA: Insufficient documentation
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
## 2022-05-01 ENCOUNTER — Ambulatory Visit: Payer: PRIVATE HEALTH INSURANCE | Admitting: Physical Therapy

## 2022-05-01 ENCOUNTER — Other Ambulatory Visit: Payer: Self-pay

## 2022-05-01 ENCOUNTER — Encounter: Payer: Self-pay | Admitting: Physical Therapy

## 2022-05-01 DIAGNOSIS — M546 Pain in thoracic spine: Secondary | ICD-10-CM

## 2022-05-01 DIAGNOSIS — M5459 Other low back pain: Secondary | ICD-10-CM | POA: Diagnosis not present

## 2022-05-01 DIAGNOSIS — M6281 Muscle weakness (generalized): Secondary | ICD-10-CM
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
## 2022-05-08 ENCOUNTER — Ambulatory Visit: Payer: PRIVATE HEALTH INSURANCE | Admitting: Physical Therapy

## 2022-05-08 ENCOUNTER — Encounter: Payer: Self-pay | Admitting: Physical Therapy

## 2022-05-08 ENCOUNTER — Other Ambulatory Visit: Payer: Self-pay

## 2022-05-08 DIAGNOSIS — M5459 Other low back pain: Secondary | ICD-10-CM

## 2022-05-08 DIAGNOSIS — M546 Pain in thoracic spine: Secondary | ICD-10-CM

## 2022-05-08 DIAGNOSIS — M6281 Muscle weakness (generalized): Secondary | ICD-10-CM
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
## 2022-05-15 ENCOUNTER — Ambulatory Visit: Payer: PRIVATE HEALTH INSURANCE

## 2022-05-15 DIAGNOSIS — M6281 Muscle weakness (generalized): Secondary | ICD-10-CM

## 2022-05-15 DIAGNOSIS — M5459 Other low back pain: Secondary | ICD-10-CM

## 2022-05-15 DIAGNOSIS — M546 Pain in thoracic spine: Secondary | ICD-10-CM
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
## 2022-05-22 ENCOUNTER — Ambulatory Visit: Payer: PRIVATE HEALTH INSURANCE

## 2022-05-22 DIAGNOSIS — M5459 Other low back pain: Secondary | ICD-10-CM

## 2022-05-22 DIAGNOSIS — M546 Pain in thoracic spine: Secondary | ICD-10-CM

## 2022-05-22 DIAGNOSIS — M6281 Muscle weakness (generalized): Secondary | ICD-10-CM
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
## 2022-05-29 ENCOUNTER — Ambulatory Visit: Payer: PRIVATE HEALTH INSURANCE | Attending: Critical Care Medicine | Admitting: Pharmacist

## 2022-05-29 ENCOUNTER — Encounter: Payer: Self-pay | Admitting: Pharmacist

## 2022-05-29 ENCOUNTER — Other Ambulatory Visit: Payer: Self-pay

## 2022-05-29 VITALS — BP 119/77 | HR 76

## 2022-05-29 DIAGNOSIS — Z720 Tobacco use: Secondary | ICD-10-CM

## 2022-05-29 MED ORDER — VARENICLINE TARTRATE 0.5 MG PO TABS
ORAL_TABLET | ORAL | 0 refills | Status: AC
  Filled 2022-05-29: qty 11, 9d supply, fill #0
  Filled 2022-05-30: qty 49, 25d supply, fill #0

## 2022-05-29 NOTE — Progress Notes (Signed)
   S:    36 y.o. male who presents for hypertension evaluation, education, and management.  PMH is significant for HTN and tobacco use.  Patient was referred and last seen by Primary Care Provider, Dr. Joya Gaskins, on 04/17/2022.   At last visit, amlodipine was increased from 5 mg to 10 mg daily and patient was started on bupropion 150 mg daily for smoking cessation.   Today, patient arrives in good spirits and presents without assistance. Denies dizziness, headache, blurred vision, swelling. Patient does report his anxiety has gotten much worse since starting Wellbutrin. He feels like the increased anxiety has caused him to want to smoke more. The increased anxiety has also caused some arguments at home and he has not taken Wellbutirn in 4 days. Even with side effects, he has successfully reduced his tobacco intake from 1 PPD to 1/2 PPD (smokes [place] [place] menthols). No LEE since increasing amlodipine to 10 mg .   Medication adherence reported. Patient has taken BP medications today.   Current antihypertensives include: amlodipine 10 mg  Reported home BP readings: not checking  O:  Vitals:   05/29/22 0940  BP: 119/77  Pulse: 76    Last 3 Office BP readings: BP Readings from Last 3 Encounters:  05/29/22 119/77  04/17/22 (!) 141/89  03/23/22 117/74    BMET    Component Value Date/Time   NA 139 03/13/2022 1042   K 4.1 03/13/2022 1042   CL 98 03/13/2022 1042   CO2 26 03/13/2022 1042   GLUCOSE 90 03/13/2022 1042   GLUCOSE 103 (H) 05/14/2020 0950   BUN 8 03/13/2022 1042   CREATININE 1.07 03/13/2022 1042   CALCIUM 9.4 03/13/2022 1042   GFRNONAA >60 05/14/2020 0950    A/P: Hypertension longstanding currently at goal on current medications. BP goal < 130/80 mmHg. Medication adherence appears appropriate.  -Continued amlodipine 10 mg daily.  -Counseled on lifestyle modifications for blood pressure control including reduced dietary sodium, increased exercise, adequate  sleep. -Encouraged patient to check BP at home and bring log of readings to next visit. Counseled on proper use of home BP cuff.   Smoking cessation: patient has successfully reduced his intake from 1 PPD to 1/2 PPD and congratulated patient on this success. However, the increased anxiety that Wellbutrin has caused is troublesome. Will attempt a slow titration of Chantix (varenicline) given side effects on Wellbutrin.  -Start Chantix 0.5 mg once daily x7 days then increase to 0.5 mg BID thereafter. Can consider increasing to 1 mg BID at follow up if tolerated.   Patient verbalized understanding of treatment plan.  Total time in face to face counseling 20 minutes.    Follow-up:  Pharmacist PRN. PCP clinic visit on 07/18/2022.   Joseph Art, Pharm.D. PGY-2 Ambulatory Care Pharmacy Resident 05/29/2022 11:47 AM

## 2022-05-30 ENCOUNTER — Other Ambulatory Visit: Payer: Self-pay

## 2022-06-05 ENCOUNTER — Ambulatory Visit
Admission: EM | Admit: 2022-06-05 | Discharge: 2022-06-05 | Disposition: A | Discharge status: Home / Self Care | Payer: PRIVATE HEALTH INSURANCE

## 2022-06-05 ENCOUNTER — Ambulatory Visit (HOSPITAL_COMMUNITY)
Admission: EM | Admit: 2022-06-05 | Discharge: 2022-06-05 | Disposition: A | Discharge status: Home / Self Care | Payer: PRIVATE HEALTH INSURANCE

## 2022-06-05 DIAGNOSIS — F1193 Opioid use, unspecified with withdrawal: Secondary | ICD-10-CM | POA: Diagnosis not present

## 2022-06-05 DIAGNOSIS — F1123 Opioid dependence with withdrawal: Secondary | ICD-10-CM | POA: Diagnosis not present

## 2022-06-05 NOTE — Progress Notes (Signed)
Tom Morgan ROUTINE: Patient is a 36 year old male that presents voluntary this date requesting assistance with withdrawals. Patient states he has been taking 4 to 8 - 10 mg Percocets for the last three months and is trying to "get off them." Patient states his last use was 48 hours ago when he took 4 10 mg Percocets. Patient states he is experiencing withdrawals to include: nausea, diarrhea and feeling "achy all over." Patient denies the use of any other substances. Patient denies any previous mental health history. Patient denies any S/I, H/I or AVH.

## 2022-06-05 NOTE — Discharge Instructions (Signed)
Unfortunately, due to the nature of your complaint, we are unable to provide the needed medications. I recommend you head to the [facility]al [facility] urgent care now for a further evaluation and workup.
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
## 2022-06-05 NOTE — Discharge Instructions (Signed)
Substance Abuse Treatment Programs ° °Intensive Outpatient Programs °[facility]     °[address]      °[place], [place]                   °[phone]      ° °[facility] °[address] °[place], [place] °[phone] ° °[facility]     °(Inpatient and outpatient)     °[address]           °[phone]   ° °[facility] °[phone] (Suboxone and Methadone) ° °[address]      °[place], [place] [address]      °336-882-2125      ° °[address] °[place], [place] °852-3033 ° °[facility] (Outpatient/Inpatient, [facility])    °(insurance only) [phone]      °       °[facility] (Groups & Residential) °[place], [place] °[phone] ° °   °[facility]     °[address]     °[place], [place]      °[phone]      ° °[facility] (for caregivers and family) °[address] °[place], [place] °336-299-4655 ° ° ° ° ° °Residential Treatment Programs °[facility]      °[address], [place], [place] [address]  °([phone]      ° °[facility] °[address] [place], [place] [address] °[phone] ° °[facility]        °336-248-8914      ° °[facility] °1-800-659-3381 ° °[facility]             °[address]                                         °[place], [place]                                                °877-615-2722 or 336-784-9470                              ° °[facility] [place] °[address] °[place], [address] °[phone] ° °[facility]    °[address]      °[place], [place]     °[phone]      ° °[facility] [place] [facility] °[address] °, [place] °336-285-9073 ° °[facility]   °[address]     °[place], [place] [address]     °[phone]      °Admissions: 8am-3pm M-F ° °[facility] °[address] °[place],  [place] °336-227-7417 ° °[facility]: Residential Program (90 Days)   °[place], [place]      °336-725-8389 or 800-758-6077    ° °[facility]: [facility] °[place], [place] °(Walk in Hours over the weekend or by referral) ° °[facility] °[address], [place],  [address] °([phone] ° °Crisis Mobile: [facility]:  1-877-626-1772 (for crisis response 24 hours a day) °[facility] Hotline:      1-800-256-2452 °Outpatient Psychiatry and Counseling ° °[facility]: Mobile Crisis   Management 24 hours:  1-877-626-1772 ° °[facility] sliding scale fee and walk in schedule: M-F 8am-12pm/1pm-3pm °[address]  °[place], [place] [address] °[phone] ° °[facility] °[address] °[place], [place] [address] °336-703-9650 ° °[facility] (Formerly known [facility])- new patient walk-in appointments available Monday - Friday 8am -3pm.          °[address] °[place], [place] [address] °336-676-6840 or crisis line- 336-676-6905 ° °[facility]/ Intensive Outpatient Therapy Program °[address] °[place], [place] [address] °336-832-9804 ° °[facility]                  °Crisis Services      °[phone]      °[address]     °[place], [place] [address]                ° °[facility]   °[facility] °[phone] °[address] °[place], [place] [address] ° ° °[facility]          °[address]  °[place], [place] [address]       °([phone] ° °[facility] °[address] °[place], [place] [address] °336-292-1510 ° °[facility]    °[address]    °[place], [place] [address]     °336-632-3505      ° °Parish McKinney, MD     °[address]     °[place] [address]     °336-282-1251     °  °[facility] °[address] °[place] [address] ° °[facility]     °[address]     °[place], [place]      °336-542-2076      ° °[facility] °Shamsher Ahluwalia, MD °[address] °[place], [place] [address] °[phone] ° °[facility]     °[address]     °[place], [place] [address]     °336-274-1237      ° °[facility] °[address] °[place], [place] [address] °336-854-4450 °Resources for Temporary Residential Assistance/Crisis Centers ° °DAY CENTERS °[facility] °M-F 8am-3pm   °[address] [place], [place] [address]   336-332-0824 °Services include: laundry, barbering, support groups, case management, phone  & computer access, showers, AA/NA mtgs, mental health/substance abuse nurse, job skills class, disability information, VA assistance, spiritual classes, etc.  ° °HOMELESS SHELTERS ° °[facility]     °[facility]   °[address], [place]     °[phone]       °       °[facility] (women and children)       °[address] °[place], [place] [address] °336-275-0820 °[email or web address] for application and process °Application Required ° °[facility]   °[address]    °[place] [address]     °[phone]       °             °[facility] °[address] °[place],  [address] °336.273.5572 °336-235-0363(schedule application appt.) °Application Required ° °[facility] (women only)    °[address]     °[place],  [address]     °336-884-1039      °  Intake starts 6pm daily °Need valid ID, SSC, & Police report °[facility] [place] °[address] °[place], [place] °336-881-5420 °Application Required ° °[facility] (men only)     °[address]      °[place], [place]     °[phone]      ° °[facility] °(Pregnant women only) °[address] °[place], [place] °336-275-0206 ° °[facility]      °[address]      °[place], [place] [address]     °[phone]      °       °[facility] °[address] °[place], [place] °336-723-1848 °90 day commitment/SA/Application process ° °[facility](men only)     °[address]     °[place], [place]     °[phone]       °Check-in at 7pm     °       °[facility] °[address] °[place], [place] [address] °336-248-6684 °Men/Women/Women and Children must be there by 7 pm ° °[facility] °[place],  °336-722-8721                ° °
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
## 2022-06-06 ENCOUNTER — Other Ambulatory Visit: Payer: Self-pay

## 2022-06-06 ENCOUNTER — Telehealth (HOSPITAL_COMMUNITY): Payer: Self-pay | Admitting: Critical Care Medicine

## 2022-06-06 NOTE — BH Assessment (Signed)
Care Management - [facility] Follow Up Discharges   Writer made contact with the patient.  Patient reports that he received a list of resources when he was discharged and he he will be able to schedule an appointment.

## 2022-06-29 ENCOUNTER — Ambulatory Visit: Payer: PRIVATE HEALTH INSURANCE | Attending: Critical Care Medicine | Admitting: Critical Care Medicine

## 2022-06-29 ENCOUNTER — Telehealth: Payer: Self-pay | Admitting: Critical Care Medicine

## 2022-06-29 ENCOUNTER — Other Ambulatory Visit: Payer: Self-pay

## 2022-06-29 VITALS — BP 117/76 | HR 76

## 2022-06-29 DIAGNOSIS — M542 Cervicalgia: Secondary | ICD-10-CM | POA: Diagnosis not present

## 2022-06-29 DIAGNOSIS — I10 Essential (primary) hypertension: Secondary | ICD-10-CM

## 2022-06-29 DIAGNOSIS — F1120 Opioid dependence, uncomplicated: Secondary | ICD-10-CM | POA: Insufficient documentation

## 2022-06-29 DIAGNOSIS — Z720 Tobacco use: Secondary | ICD-10-CM

## 2022-06-29 DIAGNOSIS — F1123 Opioid dependence with withdrawal: Secondary | ICD-10-CM | POA: Diagnosis not present

## 2022-06-29 MED ORDER — AMLODIPINE BESYLATE 10 MG PO TABS
10.0000 mg | ORAL_TABLET | Freq: Every day | ORAL | 2 refills | Status: AC
  Filled 2022-06-29: qty 30, 30d supply, fill #0

## 2022-06-29 MED ORDER — NALOXONE HCL 4 MG/0.1ML NA LIQD
NASAL | 1 refills | Status: AC
  Filled 2022-06-29: qty 2, 1d supply, fill #0

## 2022-06-29 NOTE — Assessment & Plan Note (Signed)
Will connect patient with our licensed clinical social worker for options I suspect [facility] the best option they do offer Suboxone treatment and intensive outpatient therapy.  I did send a referral that way today but perhaps another option is available.  [facility] is not a good option because it is methadone and he has to go daily and he cannot do this with his [employer] job.  I did write a prescription for Narcan.  Patient will return to the office in follow-up in January face-to-face

## 2022-06-29 NOTE — Assessment & Plan Note (Signed)
Patient had been on Chantix but failed this and for now we will hold off on tobacco cessation until we get him off the opioids

## 2022-06-29 NOTE — Telephone Encounter (Signed)
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
## 2022-06-29 NOTE — Assessment & Plan Note (Signed)
Blood pressure seems to be well-controlled.  Continue amlodipine refills issued

## 2022-07-06 ENCOUNTER — Other Ambulatory Visit: Payer: Self-pay

## 2022-07-18 ENCOUNTER — Ambulatory Visit: Payer: PRIVATE HEALTH INSURANCE | Admitting: Critical Care Medicine

## 2022-08-01 NOTE — Telephone Encounter (Signed)
Brief conversation with Tom Morgan (he was at work). Has no complaints, wasn't able to record the resources, but did record courtney's number and will follow up for an appt, when the time permits.

## 2022-08-01 NOTE — Telephone Encounter (Signed)
Can you please contact patient. Providing resources for what Dr. Joya Gaskins Discussed. I have called a few times with no answer.

## 2022-08-17 ENCOUNTER — Ambulatory Visit: Payer: PRIVATE HEALTH INSURANCE | Admitting: Critical Care Medicine
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
